# Patient Record
Sex: Female | Born: 1985 | Race: Black or African American | Hispanic: No | Marital: Single | Smoking: Former smoker
Health system: Southern US, Community
[De-identification: ages and names within clinical notes are randomized; demographics above are authoritative.]

## PROBLEM LIST (undated history)

## (undated) ENCOUNTER — Emergency Department (HOSPITAL_COMMUNITY): Admission: EM | Discharge status: Absent without leave | Payer: 59

## (undated) DIAGNOSIS — K439 Ventral hernia without obstruction or gangrene: Secondary | ICD-10-CM

## (undated) DIAGNOSIS — K219 Gastro-esophageal reflux disease without esophagitis: Secondary | ICD-10-CM

## (undated) DIAGNOSIS — L0502 Pilonidal sinus with abscess: Secondary | ICD-10-CM

## (undated) HISTORY — DX: Ventral hernia without obstruction or gangrene: K43.9

## (undated) HISTORY — DX: Pilonidal sinus with abscess: L05.02

---
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
## 2004-10-27 ENCOUNTER — Emergency Department (HOSPITAL_COMMUNITY): Admission: EM | Admit: 2004-10-27 | Discharge: 2004-10-27 | Payer: Self-pay | Admitting: Emergency Medicine

## 2004-12-20 ENCOUNTER — Emergency Department (HOSPITAL_COMMUNITY): Admission: EM | Admit: 2004-12-20 | Discharge: 2004-12-20 | Payer: Self-pay | Admitting: Emergency Medicine

## 2005-05-27 ENCOUNTER — Other Ambulatory Visit: Admission: RE | Admit: 2005-05-27 | Discharge: 2005-05-27 | Payer: Self-pay | Admitting: Obstetrics and Gynecology

## 2005-06-14 ENCOUNTER — Inpatient Hospital Stay (HOSPITAL_COMMUNITY): Admission: AD | Admit: 2005-06-14 | Discharge: 2005-06-14 | Payer: Self-pay | Admitting: Obstetrics and Gynecology

## 2005-07-13 ENCOUNTER — Ambulatory Visit (HOSPITAL_COMMUNITY): Admission: RE | Admit: 2005-07-13 | Discharge: 2005-07-13 | Payer: Self-pay | Admitting: Obstetrics and Gynecology

## 2005-11-17 ENCOUNTER — Inpatient Hospital Stay (HOSPITAL_COMMUNITY): Admission: AD | Admit: 2005-11-17 | Discharge: 2005-11-20 | Payer: Self-pay | Admitting: Obstetrics and Gynecology

## 2005-12-29 ENCOUNTER — Other Ambulatory Visit: Admission: RE | Admit: 2005-12-29 | Discharge: 2005-12-29 | Payer: Self-pay | Admitting: Obstetrics and Gynecology

## 2006-05-21 ENCOUNTER — Emergency Department (HOSPITAL_COMMUNITY): Admission: EM | Admit: 2006-05-21 | Discharge: 2006-05-21 | Payer: Self-pay | Admitting: Emergency Medicine

## 2008-10-12 ENCOUNTER — Emergency Department (HOSPITAL_COMMUNITY): Admission: EM | Admit: 2008-10-12 | Discharge: 2008-10-12 | Payer: Self-pay | Admitting: Emergency Medicine

## 2009-10-09 ENCOUNTER — Emergency Department (HOSPITAL_BASED_OUTPATIENT_CLINIC_OR_DEPARTMENT_OTHER): Admission: EM | Admit: 2009-10-09 | Discharge: 2009-10-09 | Payer: Self-pay | Admitting: Emergency Medicine

## 2010-12-23 ENCOUNTER — Emergency Department (HOSPITAL_COMMUNITY)
Admission: EM | Admit: 2010-12-23 | Discharge: 2010-12-23 | Payer: Self-pay | Source: Home / Self Care | Admitting: Family Medicine

## 2010-12-28 LAB — POCT URINALYSIS DIPSTICK: Hgb urine dipstick: NEGATIVE

## 2011-03-10 ENCOUNTER — Emergency Department (HOSPITAL_BASED_OUTPATIENT_CLINIC_OR_DEPARTMENT_OTHER)
Admission: EM | Admit: 2011-03-10 | Discharge: 2011-03-10 | Disposition: A | Discharge status: Home / Self Care | Payer: Managed Care, Other (non HMO) | Attending: Emergency Medicine | Admitting: Emergency Medicine

## 2011-03-10 DIAGNOSIS — R002 Palpitations: Secondary | ICD-10-CM | POA: Insufficient documentation

## 2011-03-10 LAB — URINALYSIS, ROUTINE W REFLEX MICROSCOPIC
Bilirubin Urine: NEGATIVE
Glucose, UA: NEGATIVE mg/dL
Hgb urine dipstick: NEGATIVE
Ketones, ur: 15 mg/dL — AB
Nitrite: NEGATIVE
Protein, ur: NEGATIVE mg/dL
Specific Gravity, Urine: 1.024 (ref 1.005–1.030)
Urobilinogen, UA: 1 mg/dL (ref 0.0–1.0)
pH: 7 (ref 5.0–8.0)

## 2011-03-10 LAB — CBC
HCT: 35.5 % — ABNORMAL LOW (ref 36.0–46.0)
Hemoglobin: 12.2 g/dL (ref 12.0–15.0)
MCH: 29.4 pg (ref 26.0–34.0)
MCHC: 34.4 g/dL (ref 30.0–36.0)
MCV: 85.5 fL (ref 78.0–100.0)
Platelets: 234 10*3/uL (ref 150–400)
RBC: 4.15 MIL/uL (ref 3.87–5.11)
RDW: 11.7 % (ref 11.5–15.5)
WBC: 6.6 10*3/uL (ref 4.0–10.5)

## 2011-03-10 LAB — BASIC METABOLIC PANEL
BUN: 11 mg/dL (ref 6–23)
CO2: 25 mEq/L (ref 19–32)
Calcium: 9.1 mg/dL (ref 8.4–10.5)
Chloride: 104 mEq/L (ref 96–112)
Creatinine, Ser: 0.8 mg/dL (ref 0.4–1.2)
GFR calc Af Amer: >60 mL/min (ref >60)
GFR calc non Af Amer: >60 mL/min (ref >60)
Glucose, Bld: 100 mg/dL — ABNORMAL HIGH (ref 70–99)
Potassium: 4.4 mEq/L (ref 3.5–5.1)
Sodium: 141 mEq/L (ref 135–145)

## 2011-03-10 LAB — DIFFERENTIAL
Basophils Absolute: 0 10*3/uL (ref 0.0–0.1)
Eosinophils Absolute: 0 10*3/uL (ref 0.0–0.7)
Lymphocytes Relative: 28 % (ref 12–46)
Monocytes Absolute: 0.6 10*3/uL (ref 0.1–1.0)
Monocytes Relative: 9 % (ref 3–12)
Neutro Abs: 4.2 10*3/uL (ref 1.7–7.7)

## 2011-03-10 LAB — POCT CARDIAC MARKERS: Myoglobin, poc: 35.7 ng/mL (ref 12–200)

## 2011-03-10 LAB — D-DIMER, QUANTITATIVE: D-Dimer, Quant: <0.22 ug/mL-FEU (ref 0.00–0.48)

## 2011-03-10 LAB — PREGNANCY, URINE: Preg Test, Ur: NEGATIVE

## 2011-09-07 LAB — COMPREHENSIVE METABOLIC PANEL
CO2: 29
Calcium: 9.1
GFR calc Af Amer: >60
GFR calc non Af Amer: >60
Potassium: 3.5

## 2011-09-07 LAB — CBC
HCT: 35.9 — ABNORMAL LOW
MCHC: 33.4
MCV: 88.5
RBC: 4.06
WBC: 6.6

## 2011-09-07 LAB — URINALYSIS, ROUTINE W REFLEX MICROSCOPIC
Hgb urine dipstick: NEGATIVE
Nitrite: NEGATIVE
Urobilinogen, UA: 1
pH: 6.5

## 2011-09-07 LAB — WET PREP, GENITAL

## 2011-09-07 LAB — DIFFERENTIAL
Basophils Relative: 0
Eosinophils Absolute: 0
Lymphocytes Relative: 19
Lymphs Abs: 1.2
Monocytes Relative: 12
Neutro Abs: 4.6

## 2011-09-07 LAB — GC/CHLAMYDIA PROBE AMP, GENITAL: Chlamydia, DNA Probe: POSITIVE — AB

## 2011-09-07 LAB — URINE MICROSCOPIC-ADD ON

## 2011-09-24 ENCOUNTER — Other Ambulatory Visit: Payer: Self-pay | Admitting: Obstetrics and Gynecology

## 2011-09-24 ENCOUNTER — Other Ambulatory Visit (HOSPITAL_COMMUNITY)
Admission: RE | Admit: 2011-09-24 | Discharge: 2011-09-24 | Disposition: A | Discharge status: Home / Self Care | Payer: Managed Care, Other (non HMO) | Source: Ambulatory Visit | Attending: Obstetrics and Gynecology | Admitting: Obstetrics and Gynecology

## 2011-09-24 DIAGNOSIS — Z01419 Encounter for gynecological examination (general) (routine) without abnormal findings: Secondary | ICD-10-CM | POA: Insufficient documentation

## 2012-03-13 ENCOUNTER — Other Ambulatory Visit (INDEPENDENT_AMBULATORY_CARE_PROVIDER_SITE_OTHER): Payer: Self-pay | Admitting: Surgery

## 2012-03-13 ENCOUNTER — Ambulatory Visit (INDEPENDENT_AMBULATORY_CARE_PROVIDER_SITE_OTHER): Payer: Managed Care, Other (non HMO) | Admitting: Surgery

## 2012-03-13 ENCOUNTER — Encounter (INDEPENDENT_AMBULATORY_CARE_PROVIDER_SITE_OTHER): Payer: Self-pay | Admitting: Surgery

## 2012-03-13 VITALS — BP 104/70 | HR 72 | Temp 97.6°F | Resp 12 | Ht 66.0 in | Wt 149.8 lb

## 2012-03-13 DIAGNOSIS — K439 Ventral hernia without obstruction or gangrene: Secondary | ICD-10-CM

## 2012-03-13 DIAGNOSIS — F17200 Nicotine dependence, unspecified, uncomplicated: Secondary | ICD-10-CM | POA: Insufficient documentation

## 2012-03-13 NOTE — Progress Notes (Signed)
Subjective:     Patient ID: Crystal Wolfe, female   DOB: 1985/12/11, 26 y.o.   MRN: [id number]  HPI  Crystal Wolfe  Sep 16, 1986 [id number]  Patient Care Team: Bebe Liter as PCP - General (Nurse Practitioner)  This patient is a 26 y.o.female who presents today for surgical evaluation.   Reason for visit: Consideration of surgery for supraumbilical ventral wall hernia  The patient returns. I had seen her in 2008. I saw her in November 2011. She's had supraumbilical swelling and pain. I diagnosed a supraumbilical ventral hernia. It has gotten larger. I have recommended surgery. She was getting pain and discomfort especially after eating or when she was straining. When she feels bloating or stress she notes it gets more swollen uncomfortable as well. It is now out all the time. She had some financial issues in the past. She notes because discussed larger and more uncomfortable she wished to reconsider surgery.  No fevers or chills. Weight has been stable. She smokes a few cigarettes a day. Had one episode of chest pain but workup was consistent with anxiety and no issues. His relatively active and has good exercise tolerance. 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  Patient Active Problem List  Diagnoses  . Ventral hernia - supraumbilical incarcerated  . Tobacco dependence    Past Medical History  Diagnosis Date  . Ventral hernia     History reviewed. No pertinent past surgical history.  History   Social History  . Marital Status: Single    Spouse Name: N/A    Number of Children: N/A  . Years of Education: N/A   Occupational History  . Not on file.   Social History Main Topics  . Smoking status: Current Everyday Smoker -- 0.2 packs/day  . Smokeless tobacco: Not on file  . Alcohol Use: Yes  . Drug Use: No  . Sexually Active:    Other Topics Concern  . Not on file   Social History Narrative  . No narrative on file     Family History  Problem Relation Age of Onset  . Hyperlipidemia Father   . Cancer Maternal Aunt     breast  . Hypertension Maternal Grandmother   . Hypertension Paternal Grandmother     No current outpatient prescriptions on file.     Allergies  Allergen Reactions  . Penicillins     ALL CILLIN drugs!!    BP 104/70  Pulse 72  Temp(Src) 97.6 F (36.4 C) (Temporal)  Resp 12  Ht 5\' 6"  (1.676 m)  Wt 149 lb 12.8 oz (67.949 kg)  BMI 24.18 kg/m2     Review of Systems  Constitutional: Negative for fever, chills, diaphoresis, appetite change and fatigue.  HENT: Negative for ear pain, sore throat, trouble swallowing, neck pain and ear discharge.   Eyes: Negative for photophobia, discharge and visual disturbance.  Respiratory: Negative for cough, choking, chest tightness and shortness of breath.   Cardiovascular: Negative for chest pain and palpitations.  Gastrointestinal: Positive for abdominal pain. Negative for nausea, vomiting, diarrhea, constipation, blood in stool, abdominal distention, anal bleeding and rectal pain.  Genitourinary: Negative for dysuria, frequency and difficulty urinating.  Musculoskeletal: Negative for myalgias and gait problem.  Skin: Negative for color change, pallor and rash.  Neurological: Negative for dizziness, speech difficulty, weakness and numbness.  Hematological: Negative for adenopathy.  Psychiatric/Behavioral: Negative for confusion and agitation. The patient is not nervous/anxious.  Objective:   Physical Exam  Constitutional: She is oriented to person, place, and time. She appears well-developed and well-nourished. No distress.  HENT:  Head: Normocephalic.  Mouth/Throat: Oropharynx is clear and moist. No oropharyngeal exudate.  Eyes: Conjunctivae and EOM are normal. Pupils are equal, round, and reactive to light. No scleral icterus.  Neck: Normal range of motion. Neck supple. No tracheal deviation present.  Cardiovascular:  Normal rate, regular rhythm and intact distal pulses.   Pulmonary/Chest: Effort normal and breath sounds normal. No respiratory distress. She exhibits no tenderness.  Abdominal: Soft. She exhibits mass. She exhibits no distension. There is no tenderness. Hernia confirmed negative in the right inguinal area and confirmed negative in the left inguinal area.    Genitourinary: No vaginal discharge found.  Musculoskeletal: Normal range of motion. She exhibits no tenderness.  Lymphadenopathy:    She has no cervical adenopathy.       Right: No inguinal adenopathy present.       Left: No inguinal adenopathy present.  Neurological: She is alert and oriented to person, place, and time. No cranial nerve deficit. She exhibits normal muscle tone. Coordination normal.  Skin: Skin is warm and dry. No rash noted. She is not diaphoretic. No erythema.  Psychiatric: She has a normal mood and affect. Her behavior is normal. Judgment and thought content normal.       Assessment:     Supraumbilical VWH, now incarcerated with pain    Plan:     I again recommended surgery. Is not incarcerated. I think eventually doing it laparoscopically is to make sure she does not have a umbilical hernia as well. Another option is to primary repair. She is thin. However she is a smoker. She wishes to have mesh for long-term repair.  The anatomy & physiology of the abdominal wall was discussed.  The pathophysiology of hernias was discussed.  Natural history risks without surgery including progeressive enlargement, pain, incarceration & strangulation was discussed.   Contributors to complications such as smoking, obesity, diabetes, prior surgery, etc were discussed.   I feel the risks of no intervention will lead to serious problems that outweigh the operative risks; therefore, I recommended surgery to reduce and repair the hernia.  I explained laparoscopic techniques with possible need for an open approach.  I noted the probable  use of mesh to patch and/or buttress the hernia repair  Risks such as bleeding, infection, abscess, need for further treatment, heart attack, death, and other risks were discussed.  I noted a good likelihood this will help address the problem.   Goals of post-operative recovery were discussed as well.  Possibility that this will not correct all symptoms was explained.  I stressed the importance of low-impact activity, aggressive pain control, avoiding constipation, & not pushing through pain to minimize risk of post-operative chronic pain or injury. Possibility of reherniation especially with smoking, obesity, diabetes, immunosuppression, and other health conditions was discussed.  We will work to minimize complications.     An educational handout further explaining the pathology & treatment options was given as well.  Questions were answered.  The patient expresses understanding & wishes to proceed with surgery.  We talked to the patient about the dangers of smoking.  We stressed that tobacco use dramatically increases the risk of peri-operative complications such as infection, tissue necrosis leaving to problems with incision/wound and organ healing, heart attack, stroke, DVT, pulmonary embolism, and death.  We noted there are programs in our community to help stop  smoking.

## 2012-03-13 NOTE — Patient Instructions (Signed)
Hernia, Surgical Repair A hernia occurs when an internal organ pushes out through a weak spot in the belly (abdominal) wall muscles. Hernias commonly occur in the groin and around the navel. Hernias often can be pushed back into place (reduced). Most hernias tend to get worse over time. Problems occur when abdominal contents get stuck in the opening (incarcerated hernia). The blood supply gets cut off (strangulated hernia). This is an emergency and needs surgery. Otherwise, hernia repair can be an elective procedure. This means you can schedule this at your convenience when an emergency is not present. Because complications can occur, if you decide to repair the hernia, it is best to do it soon. When it becomes an emergency procedure, there is increased risk of complications after surgery. CAUSES   Heavy lifting.   Obesity.   Prolonged coughing.   Straining to move your bowels.   Hernias can also occur through a cut (incision) by a surgeonafter an abdominal operation.  HOME CARE INSTRUCTIONS Before the repair:  Bed rest is not required. You may continue your normal activities, but avoid heavy lifting (more than 10 pounds) or straining. Cough gently. If you are a smoker, it is best to stop. Even the best hernia repair can break down with the continual strain of coughing.   Do not wear anything tight over your hernia. Do not try to keep it in with an outside bandage or truss. These can damage abdominal contents if they are trapped in the hernia sac.   Eat a normal diet. Avoid constipation. Straining over long periods of time to have a bowel movement will increase hernia size. It also can breakdown repairs. If you cannot do this with diet alone, laxatives or stool softeners may be used.  PRIOR TO SURGERY, SEEK IMMEDIATE MEDICAL CARE IF: You have problems (symptoms) of a trapped (incarcerated) hernia. Symptoms include:  An oral temperature above 102 F (38.9 C) develops, or as your caregiver  suggests.   Increasing abdominal pain.   Feeling sick to your stomach(nausea) and vomiting.   You stop passing gas or stool.   The hernia is stuck outside the abdomen, looks discolored, feels hard, or is tender.   You have any changes in your bowel habits or in the hernia that is unusual for you.  LET YOUR CAREGIVERS KNOW ABOUT THE FOLLOWING:  Allergies.   Medications taken including herbs, eye drops, over the counter medications, and creams.   Use of steroids (by mouth or creams).   Family or personal history of problems with anesthetics or Novocaine.   Possibility of pregnancy, if this applies.   Personal history of blood clots (thrombophlebitis).   Family or personal history of bleeding or blood problems.   Previous surgery.   Other health problems.  BEFORE THE PROCEDURE You should be present 1 hour prior to your procedure, or as directed by your caregiver.  AFTER THE PROCEDURE After surgery, you will be taken to the recovery area. A nurse will watch and check your progress there. Once you are awake, stable, and taking fluids well, you will be allowed to go home as long as there are no problems. Once home, an ice pack (wrapped in a light towel) applied to your operative site may help with discomfort. It may also keep the swelling down. Do not lift anything heavier than 10 pounds (4.55 kilograms). Take showers not baths. Do not drive while taking narcotics. Follow instructions as suggested by your caregiver.  SEEK IMMEDIATE MEDICAL CARE IF: After   surgery:  There is redness, swelling, or increasing pain in the wound.   There is pus coming from the wound.   There is drainage from a wound lasting longer than 1 day.   An unexplained oral temperature above 102 F (38.9 C) develops.   You notice a foul smell coming from the wound or dressing.   There is a breaking open of a wound (edged not staying together) after the sutures have been removed.   You notice increasing  pain in the shoulders (shoulder strap areas).   You develop dizzy episodes or fainting while standing.   You develop persistent nausea or vomiting.   You develop a rash.   You have difficulty breathing.   You develop any reaction or side effects to medications given.  MAKE SURE YOU:   Understand these instructions.   Will watch your condition.   Will get help right away if you are not doing well or get worse.  Document Released: 05/18/2001 Document Revised: 11/11/2011 Document Reviewed: 04/09/2008 [facility] Patient Information 2012 [place], [place].  Smoking Cessation, Tips for Success YOU CAN QUIT SMOKING If you are ready to quit smoking, congratulations! You have chosen to help yourself be healthier. Cigarettes bring nicotine, tar, carbon monoxide, and other irritants into your body. Your lungs, heart, and blood vessels will be able to work better without these poisons. There are many different ways to quit smoking. Nicotine gum, nicotine patches, a nicotine inhaler, or nicotine nasal spray can help with physical craving. Hypnosis, support groups, and medicines help break the habit of smoking. Here are some tips to help you quit for good.  Throw away all cigarettes.   Clean and remove all ashtrays from your home, work, and car.   On a card, write down your reasons for quitting. Carry the card with you and read it when you get the urge to smoke.   Cleanse your body of nicotine. Drink enough water and fluids to keep your urine clear or pale yellow. Do this after quitting to flush the nicotine from your body.   Learn to predict your moods. Do not let a bad situation be your excuse to have a cigarette. Some situations in your life might tempt you into wanting a cigarette.   Never have "just one" cigarette. It leads to wanting another and another. Remind yourself of your decision to quit.   Change habits associated with smoking. If you smoked while driving or when feeling stressed,  try other activities to replace smoking. Stand up when drinking your coffee. Brush your teeth after eating. Sit in a different chair when you read the paper. Avoid alcohol while trying to quit, and try to drink fewer caffeinated beverages. Alcohol and caffeine may urge you to smoke.   Avoid foods and drinks that can trigger a desire to smoke, such as sugary or spicy foods and alcohol.   Ask people who smoke not to smoke around you.   Have something planned to do right after eating or having a cup of coffee. Take a walk or exercise to perk you up. This will help to keep you from overeating.   Try a relaxation exercise to calm you down and decrease your stress. Remember, you may be tense and nervous for the first 2 weeks after you quit, but this will pass.   Find new activities to keep your hands busy. Play with a pen, coin, or rubber band. Doodle or draw things on paper.   Brush your teeth right  after eating. This will help cut down on the craving for the taste of tobacco after meals. You can try mouthwash, too.   Use oral substitutes, such as lemon drops, carrots, a cinnamon stick, or chewing gum, in place of cigarettes. Keep them handy so they are available when you have the urge to smoke.   When you have the urge to smoke, try deep breathing.   Designate your home as a nonsmoking area.   If you are a heavy smoker, ask your caregiver about a prescription for nicotine chewing gum. It can ease your withdrawal from nicotine.   Reward yourself. Set aside the cigarette money you save and buy yourself something nice.   Look for support from others. Join a support group or smoking cessation program. Ask someone at home or at work to help you with your plan to quit smoking.   Always ask yourself, "Do I need this cigarette or is this just a reflex?" Tell yourself, "Today, I choose not to smoke," or "I do not want to smoke." You are reminding yourself of your decision to quit, even if you do smoke a  cigarette.  HOW WILL I FEEL WHEN I QUIT SMOKING?  The benefits of not smoking start within days of quitting.   You may have symptoms of withdrawal because your body is used to nicotine (the addictive substance in cigarettes). You may crave cigarettes, be irritable, feel very hungry, cough often, get headaches, or have difficulty concentrating.   The withdrawal symptoms are only temporary. They are strongest when you first quit but will go away within 10 to 14 days.   When withdrawal symptoms occur, stay in control. Think about your reasons for quitting. Remind yourself that these are signs that your body is healing and getting used to being without cigarettes.   Remember that withdrawal symptoms are easier to treat than the major diseases that smoking can cause.   Even after the withdrawal is over, expect periodic urges to smoke. However, these cravings are generally short-lived and will go away whether you smoke or not. Do not smoke!   If you relapse and smoke again, do not lose hope. Most smokers quit 3 times before they are successful.   If you relapse, do not give up! Plan ahead and think about what you will do the next time you get the urge to smoke.  LIFE AS A NONSMOKER: MAKE IT FOR A MONTH, MAKE IT FOR LIFE Day 1: Hang this page where you will see it every day. Day 2: Get rid of all ashtrays, matches, and lighters. Day 3: Drink water. Breathe deeply between sips. Day 4: Avoid places with smoke-filled air, such as bars, clubs, or the smoking section of restaurants. Day 5: Keep track of how much money you save by not smoking. Day 6: Avoid boredom. Keep a good book with you or go to the movies. Day 7: Reward yourself! One week without smoking! Day 8: Make a dental appointment to get your teeth cleaned. Day 9: Decide how you will turn down a cigarette before it is offered to you. Day 10: Review your reasons for quitting. Day 11: Distract yourself. Stay active to keep your mind off  smoking and to relieve tension. Take a walk, exercise, read a book, do a crossword puzzle, or try a new hobby. Day 12: Exercise. Get off the bus before your stop or use stairs instead of escalators. Day 13: Call on friends for support and encouragement. Day 14: Reward  yourself! Two weeks without smoking! Day 15: Practice deep breathing exercises. Day 16: Bet a friend that you can stay a nonsmoker. Day 17: Ask to sit in nonsmoking sections of restaurants. Day 18: Hang up "No Smoking" signs. Day 19: Think of yourself as a nonsmoker. Day 20: Each morning, tell yourself you will not smoke. Day 21: Reward yourself! Three weeks without smoking! Day 22: Think of smoking in negative ways. Remember how it stains your teeth, gives you bad breath, and leaves you short of breath. Day 23: Eat a nutritious breakfast. Day 24:Do not relive your days as a smoker. Day 25: Hold a pencil in your hand when talking on the telephone. Day 26: Tell all your friends you do not smoke. Day 27: Think about how much better food tastes. Day 28: Remember, one cigarette is one too many. Day 29: Take up a hobby that will keep your hands busy. Day 30: Congratulations! One month without smoking! Give yourself a big reward. Your caregiver can direct you to community resources or hospitals for support, which may include:  Group support.   Education.   Hypnosis.   Subliminal therapy.  Document Released: 08/20/2004 Document Revised: 11/11/2011 Document Reviewed: 09/08/2009 [facility] Patient Information 2012 [place], [place].

## 2012-03-24 ENCOUNTER — Encounter (INDEPENDENT_AMBULATORY_CARE_PROVIDER_SITE_OTHER): Payer: Self-pay | Admitting: Surgery

## 2012-03-24 ENCOUNTER — Ambulatory Visit (INDEPENDENT_AMBULATORY_CARE_PROVIDER_SITE_OTHER): Payer: Managed Care, Other (non HMO) | Admitting: Surgery

## 2012-03-24 VITALS — BP 126/72 | HR 68 | Temp 97.8°F | Resp 18 | Ht 66.0 in | Wt 148.2 lb

## 2012-03-24 DIAGNOSIS — L0501 Pilonidal cyst with abscess: Secondary | ICD-10-CM

## 2012-03-24 HISTORY — PX: INCISE AND DRAIN ABCESS: PRO64

## 2012-03-24 MED ORDER — HYDROCODONE-ACETAMINOPHEN 5-325 MG PO TABS: 1.0000 | ORAL_TABLET | Freq: Four times a day (QID) | ORAL | Status: AC | PRN

## 2012-03-24 MED ORDER — SULFAMETHOXAZOLE-TRIMETHOPRIM 800-160 MG PO TABS: 2.0000 | ORAL_TABLET | Freq: Two times a day (BID) | ORAL | Status: DC

## 2012-03-24 NOTE — Progress Notes (Signed)
Subjective:     Patient ID: Crystal Wolfe, female   DOB: Sep 28, 1986, 26 y.o.   MRN: [id number]  HPI  Crystal Wolfe  [birth year] [id number]  Patient Care Team: Bebe Liter as PCP - General (Nurse Practitioner)  This patient is a 26 y.o.female who presents today for surgical evaluation.   Reason for visit: Recurrent pilonidal abscess.  Patient is a healthy female I've seen her for an umbilical hernia. I was planning to do umbilical hernia repair on her since it's gotten larger.   She noted 4 days ago she began to get pain on her tailbone. She has a history of a pilonidal abscesses that have been drained in the past. At least 2 times it has been drained. Many times just with antibiotics. Some spontaneously drained. She's not had the problem for 3 years. Because it worsened, she came in for evaluation.  Patient Active Problem List  Diagnoses  . Ventral hernia - supraumbilical incarcerated  . Tobacco dependence    Past Medical History  Diagnosis Date  . Ventral hernia   . Pilonidal cyst     History reviewed. No pertinent past surgical history.  History   Social History  . Marital Status: Single    Spouse Name: N/A    Number of Children: N/A  . Years of Education: N/A   Occupational History  . Not on file.   Social History Main Topics  . Smoking status: Current Everyday Smoker -- 0.2 packs/day    Types: Cigarettes  . Smokeless tobacco: Never Used  . Alcohol Use: Yes  . Drug Use: No  . Sexually Active: Not on file   Other Topics Concern  . Not on file   Social History Narrative  . No narrative on file    Family History  Problem Relation Age of Onset  . Hyperlipidemia Father   . Cancer Maternal Aunt     breast  . Hypertension Maternal Grandmother   . Hypertension Paternal Grandmother     No current outpatient prescriptions on file.     Allergies  Allergen Reactions  . Penicillins     ALL CILLIN drugs!!    BP 126/72  Pulse 68  Temp(Src)  97.8 F (36.6 C) (Temporal)  Resp 18  Ht 5\' 6"  (1.676 m)  Wt 148 lb 4 oz (67.246 kg)  BMI 23.93 kg/m2     Review of Systems  Constitutional: Negative for fever, chills and diaphoresis.  HENT: Negative for ear pain, sore throat and trouble swallowing.   Eyes: Negative for photophobia and visual disturbance.  Respiratory: Negative for cough and choking.   Cardiovascular: Negative for chest pain and palpitations.  Gastrointestinal: Negative for nausea, vomiting, abdominal pain, diarrhea, constipation and anal bleeding.  Genitourinary: Negative for dysuria, frequency and difficulty urinating.  Musculoskeletal: Negative for myalgias and gait problem.  Skin: Negative for color change, pallor and rash.  Neurological: Negative for dizziness, speech difficulty, weakness and numbness.  Hematological: Negative for adenopathy.  Psychiatric/Behavioral: Negative for confusion and agitation. The patient is not nervous/anxious.        Objective:   Physical Exam  Constitutional: She is oriented to person, place, and time. She appears well-developed and well-nourished. No distress.  HENT:  Head: Normocephalic.  Mouth/Throat: Oropharynx is clear and moist. No oropharyngeal exudate.  Eyes: Conjunctivae and EOM are normal. Pupils are equal, round, and reactive to light. No scleral icterus.  Neck: Normal range of motion. No tracheal deviation present.  Cardiovascular: Normal rate and intact  distal pulses.   Pulmonary/Chest: Effort normal. No respiratory distress. She exhibits no tenderness.  Abdominal: Soft. She exhibits no distension. There is no tenderness. Hernia confirmed negative in the right inguinal area and confirmed negative in the left inguinal area.       Incisions clean with normal healing ridges.  No hernias  Genitourinary: Vagina normal. No vaginal discharge found.       Perianal skin clean with good hygiene.  No pruritis.  No external skin tags / hemorrhoids of significance.   No  fissure.  No abscess/fistula.    Normal sphincter tone.    R medial gluteal 5x5cm induration w punctate intragluteal opening c/w pilonidal disease  Musculoskeletal: Normal range of motion. She exhibits no tenderness.  Lymphadenopathy:       Right: No inguinal adenopathy present.       Left: No inguinal adenopathy present.  Neurological: She is alert and oriented to person, place, and time. No cranial nerve deficit. She exhibits normal muscle tone. Coordination normal.  Skin: Skin is warm and dry. No rash noted. She is not diaphoretic.  Psychiatric: She has a normal mood and affect. Her behavior is normal.       Assessment:     Recurrent pilonidal abscess    Plan:     The anatomy & physiology of the lower back anorectal region was discussed.  The pathophysiology of pilonidal abscess and differential diagnosis was discussed.  Natural history progression was discussed.   I stressed the importance of a bowel regimen to have daily soft bowel movements to minimize progression of disease.     The patient's symptoms are not adequately controlled.  Non-operative treatment has not healed the abscess.  Therefore, I recommended incision & drainage of the abscess to allow the infection to resolve and heal.  Technique, risks, benefits, alternatives discussed.  The patient expressed understanding & wished to proceed.  The patient was positioned lateral decubitus.  I placed a field block with local anaesthetic.  I incised the skin over the abscess to release the infection.  I excised skin at the wound to have an adequate opening for drainage & prevent skin reclosure.  I packed the wound with ribbon NU-Gauze.  The patient was tearful and anxious at first but calmed down at the end after pain was under control and tolerated the procedure.   Educational handouts further explaining the pathology, treatment options, and bowel regimen were given.  I think she is going to require more definitive excision once  this current infection is resolved. I noted I think this needs to be resolved first before putting and plastic mesh for her umbilical hernia. She agrees. We will have the patient return to clinic for close follow up to make sure the infection heals.  She expressed understanding and appreciation.

## 2012-03-24 NOTE — Patient Instructions (Signed)
WOUND CARE  It is important that the wound be kept open.   -Keeping the skin edges apart will allow the wound to gradually heal from the base upwards.   - If the skin edges of the wound close too early, a new fluid pocket can form and infection can occur. -This is the reason to pack deeper wounds with gauze or ribbon -This is why drained wounds cannot be sewed closed right away  A healthy wound should form a lining of bright red "beefy" granulating tissue that will help shrink the wound and help the edges grow new skin into it.   -A little mucus / yellow discharge is normal (the body's natural way to try and form a scab) and should be gently washed off with soap and water with daily dressing changes.  -Green or foul smelling drainage implies bacterial colonization and can slow wound healing - a short course of antibiotic ointment (3-5 days) can help it clear up.  Call the doctor if it does not improve or worsens  -Avoid use of antibiotic ointments for more than a week as they can slow wound healing over time.    -Sometimes other wound care products will be used to reduce need for dressing changes and/or help clean up dirty wounds -Sometimes the surgeon needs to debride the wound in the office to remove dead or infected tissue out of the wound so it can heal more quickly and safely.    Change the dressing at least once a day -Wash the wound with mild soap and water gently every day.  It is good to shower or bathe the wound to help it clean out. -Use clean 4x4 gauze for medium/large wounds or ribbon plain NU-gauze for smaller wounds (it does not need to be sterile, just clean) -Keep the raw wound moist with a little saline or KY (saline) gel on the gauze.  -A dry wound will take longer to heal.  -Keep the skin dry around the wound to prevent breakdown and irritation. -Pack the wound down to the base -The goal is to keep the skin apart, not overpack the wound -Use a Q-tip or blunt-tipped kabob  stick toothpick to push the gauze down to the base in narrow or deep wounds   -Cover with a clean gauze and tape -paper or Medipore tape tend to be gentle on the skin -rotate the orientation of the tape to avoid repeated stress/trauma on the skin -using an ACE or Coban wrap on wounds on arms or legs can be used instead.  Complete all antibiotics through the entire prescription to help the infection heal and prevent new places of infection   Returning the see the surgeon is helpful to follow the healing process and help the wound close as fast as possible.  Pilonidal Cyst A pilonidal cyst occurs when hairs get trapped (ingrown) beneath the skin in the crease between the buttocks over your sacrum (the bone under that crease). Pilonidal cysts are most common in young men with a lot of body hair. When the cyst is ruptured (breaks) or leaking, fluid from the cyst may cause burning and itching. If the cyst becomes infected, it causes a painful swelling filled with pus (abscess). The pus and trapped hairs need to be removed (often by lancing) so that the infection can heal. However, recurrence is common and an operation may be needed to remove the cyst. HOME CARE INSTRUCTIONS   If the cyst was NOT INFECTED:   Keep the   area clean and dry. Bathe or shower daily. Wash the area well with a germ-killing soap. Warm tub baths may help prevent infection and help with drainage. Dry the area well with a towel.   Avoid tight clothing to keep area as moisture free as possible.   Keep area between buttocks as free of hair as possible. A depilatory may be used.   If the cyst WAS INFECTED and needed to be drained:   Your caregiver packed the wound with gauze to keep the wound open. This allows the wound to heal from the inside outwards and continue draining.   Return for a wound check in 1 day or as suggested.   If you take tub baths or showers, repack the wound with gauze following them. Sponge baths (at the  sink) are a good alternative.   If an antibiotic was ordered to fight the infection, take as directed.   Only take over-the-counter or prescription medicines for pain, discomfort, or fever as directed by your caregiver.   After the drain is removed, use sitz baths for 20 minutes 4 times per day. Clean the wound gently with mild unscented soap, pat dry, and then apply a dry dressing.  SEEK MEDICAL CARE IF:   You have increased pain, swelling, redness, drainage, or bleeding from the area.   You have a fever.   You have muscles aches, dizziness, or a general ill feeling.  Document Released: 11/19/2000 Document Revised: 11/11/2011 Document Reviewed: 01/17/2009 Exit[employer] Patient Information 2012 [employer]. 

## 2012-03-27 ENCOUNTER — Telehealth (INDEPENDENT_AMBULATORY_CARE_PROVIDER_SITE_OTHER): Payer: Self-pay | Admitting: General Surgery

## 2012-03-27 NOTE — Telephone Encounter (Signed)
Pt calling in follow-up to I&D pilonidal abscess.  The wound was packed by Dr. Michaell Cowing with NU-gauze and it has begun to trail out.  Pt asking if she should remove the packing or leave it alone.  Advised OK to remove it if it is already coming out.  She states "it is hard and I'm afraid to do it."  Instructed to use the saline to moisten it first, then it will come out easily.  She is still hesitant.  Pt also states she has not been taking her antibiotic as ordered because it makes her sick.  [She was ordered Septra 2 BID for 2 weeks.]  She has been taking 1 pill twice a day "mostly."  Recommended she take both pills just before finishing meals, letting the food buffer the meds through her stomach.  She states she will try.

## 2012-04-03 ENCOUNTER — Encounter (INDEPENDENT_AMBULATORY_CARE_PROVIDER_SITE_OTHER): Payer: Self-pay | Admitting: Surgery

## 2012-04-03 ENCOUNTER — Ambulatory Visit (INDEPENDENT_AMBULATORY_CARE_PROVIDER_SITE_OTHER): Payer: Managed Care, Other (non HMO) | Admitting: Surgery

## 2012-04-03 VITALS — BP 118/72 | HR 78 | Temp 99.6°F | Resp 16 | Ht 66.0 in | Wt 152.1 lb

## 2012-04-03 DIAGNOSIS — L0501 Pilonidal cyst with abscess: Secondary | ICD-10-CM

## 2012-04-03 MED ORDER — SULFAMETHOXAZOLE-TRIMETHOPRIM 800-160 MG PO TABS: 1.0000 | ORAL_TABLET | Freq: Two times a day (BID) | ORAL | Status: AC

## 2012-04-03 NOTE — Patient Instructions (Signed)
Pilonidal Cyst A pilonidal cyst occurs when hairs get trapped (ingrown) beneath the skin in the crease between the buttocks over your sacrum (the bone under that crease). Pilonidal cysts are most common in young men with a lot of body hair. When the cyst is ruptured (breaks) or leaking, fluid from the cyst may cause burning and itching. If the cyst becomes infected, it causes a painful swelling filled with pus (abscess). The pus and trapped hairs need to be removed (often by lancing) so that the infection can heal. However, recurrence is common and an operation may be needed to remove the cyst. HOME CARE INSTRUCTIONS   If the cyst was NOT INFECTED:   Keep the area clean and dry. Bathe or shower daily. Wash the area well with a germ-killing soap. Warm tub baths may help prevent infection and help with drainage. Dry the area well with a towel.   Avoid tight clothing to keep area as moisture free as possible.   Keep area between buttocks as free of hair as possible. A depilatory may be used.   If the cyst WAS INFECTED and needed to be drained:   Your caregiver packed the wound with gauze to keep the wound open. This allows the wound to heal from the inside outwards and continue draining.   Return for a wound check in 1 day or as suggested.   If you take tub baths or showers, repack the wound with gauze following them. Sponge baths (at the sink) are a good alternative.   If an antibiotic was ordered to fight the infection, take as directed.   Only take over-the-counter or prescription medicines for pain, discomfort, or fever as directed by your caregiver.   After the drain is removed, use sitz baths for 20 minutes 4 times per day. Clean the wound gently with mild unscented soap, pat dry, and then apply a dry dressing.  SEEK MEDICAL CARE IF:   You have increased pain, swelling, redness, drainage, or bleeding from the area.   You have a fever.   You have muscles aches, dizziness, or a  general ill feeling.  Document Released: 11/19/2000 Document Revised: 11/11/2011 Document Reviewed: 01/17/2009 [employer] Patient Information 2012 [employer]. 

## 2012-04-03 NOTE — Progress Notes (Signed)
Subjective:     Patient ID: Crystal Wolfe, female   DOB: 27-Apr-1986, 26 y.o.   MRN: [id number]  HPI   Crystal Wolfe  Mar 17, 1986 [id number]  Patient Care Team: Bebe Liter as PCP - General (Nurse Practitioner)  This patient is a 26 y.o.female who presents today for surgical evaluation.   Reason for visit: Recurrent pilonidal abscess.  Procedure: I&D 03/24/2012  Patient is a healthy female I've seen her for an umbilical hernia. I was planning to do umbilical hernia repair on her since it's gotten larger.   She noted 4 days ago she began to get pain on her tailbone. She has a history of a pilonidal abscesses that have been drained in the past. At least 2 times it has been drained. Many times just with antibiotics. Some spontaneously drained. She's not had the problem for 3 years. Because it worsened, she came in for evaluation.  I drained it 03/24/2012.   She eventually was able to remove the packing. She was very anxious about it. She was put in cotton swabs around the wound. It is closed down. She has not been able to tolerate 2 b.i.d. Bactrim due to nausea. She's been doing 2 QAM.  Patient Active Problem List  Diagnoses  . Ventral hernia - supraumbilical incarcerated  . Tobacco dependence  . Pilonidal abscess    Past Medical History  Diagnosis Date  . Ventral hernia   . Pilonidal cyst     Past Surgical History  Procedure Date  . Incise and drain abcess 03/24/12    pilonidal cyst    History   Social History  . Marital Status: Single    Spouse Name: N/A    Number of Children: N/A  . Years of Education: N/A   Occupational History  . Not on file.   Social History Main Topics  . Smoking status: Passive Smoker -- 0.1 packs/day    Types: Cigarettes  . Smokeless tobacco: Never Used  . Alcohol Use: Yes  . Drug Use: No  . Sexually Active: Not on file   Other Topics Concern  . Not on file   Social History Narrative  . No narrative on file    Family  History  Problem Relation Age of Onset  . Hyperlipidemia Father   . Cancer Maternal Aunt     breast  . Hypertension Maternal Grandmother   . Hypertension Paternal Grandmother     Current Outpatient Prescriptions  Medication Sig Dispense Refill  . HYDROcodone-acetaminophen (NORCO) 5-325 MG per tablet Take 1-2 tablets by mouth every 6 (six) hours as needed for pain.  30 tablet  1  . sulfamethoxazole-trimethoprim (BACTRIM DS,SEPTRA DS) 800-160 MG per tablet Take 2 tablets by mouth 2 (two) times daily.  28 tablet  2     Allergies  Allergen Reactions  . Penicillins     ALL CILLIN drugs!!    BP 118/72  Pulse 78  Temp(Src) 99.6 F (37.6 C) (Temporal)  Resp 16  Ht 5\' 6"  (1.676 m)  Wt 152 lb 2 oz (69.003 kg)  BMI 24.55 kg/m2     Review of Systems  Constitutional: Negative for fever, chills and diaphoresis.  HENT: Negative for ear pain, sore throat and trouble swallowing.   Eyes: Negative for photophobia and visual disturbance.  Respiratory: Negative for cough and choking.   Cardiovascular: Negative for chest pain and palpitations.  Gastrointestinal: Negative for nausea, vomiting, abdominal pain, diarrhea, constipation and anal bleeding.  Genitourinary: Negative for dysuria, frequency  and difficulty urinating.  Musculoskeletal: Negative for myalgias and gait problem.  Skin: Negative for color change, pallor and rash.  Neurological: Negative for dizziness, speech difficulty, weakness and numbness.  Hematological: Negative for adenopathy.  Psychiatric/Behavioral: Negative for confusion and agitation. The patient is not nervous/anxious.        Objective:   Physical Exam  Constitutional: She is oriented to person, place, and time. She appears well-developed and well-nourished. No distress.  HENT:  Head: Normocephalic.  Mouth/Throat: Oropharynx is clear and moist. No oropharyngeal exudate.  Eyes: Conjunctivae and EOM are normal. Pupils are equal, round, and reactive to light.  No scleral icterus.  Neck: Normal range of motion. No tracheal deviation present.  Cardiovascular: Normal rate and intact distal pulses.   Pulmonary/Chest: Effort normal. No respiratory distress. She exhibits no tenderness.  Abdominal: Soft. She exhibits no distension. There is no tenderness. Hernia confirmed negative in the right inguinal area and confirmed negative in the left inguinal area.       Incisions clean with normal healing ridges.  No hernias  Genitourinary: Vagina normal. No vaginal discharge found.       Perianal skin clean with good hygiene.  No pruritis.  No external skin tags / hemorrhoids of significance.   No fissure.  No abscess/fistula.    R medial gluteal wound closed with 1x2cm firmness - markedly improved.  Musculoskeletal: Normal range of motion. She exhibits no tenderness.  Lymphadenopathy:       Right: No inguinal adenopathy present.       Left: No inguinal adenopathy present.  Neurological: She is alert and oriented to person, place, and time. No cranial nerve deficit. She exhibits normal muscle tone. Coordination normal.  Skin: Skin is warm and dry. No rash noted. She is not diaphoretic.  Psychiatric: She has a normal mood and affect. Her behavior is normal.       Assessment:     Recurrent pilonidal abscess s/p I&D, healing well    Plan:     I think we need to delay the umbilical hernia repair until she is several weeks out from the pilonidal disease & antibiotics to ensure she will not have recurrent infections I would make mesh and hernia repair very complicated. I  She is at high risk of infection and complications until both issues are further apart from each other. Given the fact that she has had prior incision and drainages in the region, she is at high risk for recurrence. Maybe she benefit from a more formal excision and closure on the time of the hernia. Denies DVT separate, however, may need combined and. The key thing is to avoid any episodes of  infection. We'll delay the umbilical ventral hernia parents social and June  Bactrim one pill twice a day instead of doing only in the morning.  The anatomy of the intragluteal cleft was discussed. Pathophysiology of pilonidal disease was discussed. The importance of keeping hairs trimmed to avoid recurrence was discussed. Discussion of options such as curretage, excision with closure vs leaving open was discussed. Risks of infection and need for incision and drainage & antibiotics were discussed.  I noted a good likelihood this will help address the problem.    At this point, I think the patient would best served with considering surgery to excise the diseased tissue. I will make an attempt to close but it may need to be left open to allow it to heal with secondary intention and wound packing. Possible recurrences involve muscle  flaps or different techniques were discussed as well. I noted that recurrence is higher with poor compliance on care maintenance and hygiene. The patient's questions were answered. The patient agrees to proceed.

## 2012-05-06 HISTORY — PX: HERNIA REPAIR: SHX51

## 2012-05-06 HISTORY — PX: VENTRAL HERNIA REPAIR: SHX424

## 2012-05-25 DIAGNOSIS — K436 Other and unspecified ventral hernia with obstruction, without gangrene: Secondary | ICD-10-CM

## 2012-06-01 ENCOUNTER — Telehealth (INDEPENDENT_AMBULATORY_CARE_PROVIDER_SITE_OTHER): Payer: Self-pay | Admitting: General Surgery

## 2012-06-01 NOTE — Telephone Encounter (Signed)
Pt calling to report no BM since surgery on 05/25/12.  She is passing gas and has increased her fluid intake.  She took Miralax once yesterday; there has been not urge to go at all.  Reassured pt and suggested she continue to drink lots of fluids, begin to walk more, increase Miralax to 1 capful in 10 oz BID (okay to use 2 capfuls/ 10oz BID.)  She understands and will try this.

## 2012-06-27 ENCOUNTER — Encounter (INDEPENDENT_AMBULATORY_CARE_PROVIDER_SITE_OTHER): Payer: Self-pay | Admitting: Surgery

## 2012-06-27 ENCOUNTER — Ambulatory Visit (INDEPENDENT_AMBULATORY_CARE_PROVIDER_SITE_OTHER): Payer: Managed Care, Other (non HMO) | Admitting: Surgery

## 2012-06-27 VITALS — BP 122/68 | HR 76 | Temp 98.8°F | Resp 12 | Ht 66.0 in | Wt 150.4 lb

## 2012-06-27 DIAGNOSIS — K439 Ventral hernia without obstruction or gangrene: Secondary | ICD-10-CM

## 2012-06-27 NOTE — Progress Notes (Signed)
Subjective:     Patient ID: Crystal Wolfe, female   DOB: Jul 31, 1986, 26 y.o.   MRN: [id number]  HPI   Crystal Wolfe  02/26/1986 [id number]  Patient Care Team: Bebe Liter as PCP - General (Nurse Practitioner)  This patient is a 26 y.o.female who presents today for surgical evaluation.   Procedure: Laparoscopic repair of supra-and periumbilical ventral hernias 6/202013  The patient returns ater a laparoscopic repair of periumbilical ventral hernias.  I had seen her in 2008. I saw her in November 2011.  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  Soreness is down.  Urinating fine.  Moving her bowels well.  Back to work.  Tends to avoid heavy exercise/lifting.  Minimal cigarettes now  Patient Active Problem List  Diagnosis  . Ventral hernia - supraumbilical incarcerated  . Tobacco dependence  . Pilonidal abscess    Past Medical History  Diagnosis Date  . Ventral hernia   . Pilonidal cyst     Past Surgical History  Procedure Date  . Incise and drain abcess 03/24/12    pilonidal cyst  . Ventral hernia repair 05/2012    lap supraumb VWH repair    History   Social History  . Marital Status: Single    Spouse Name: N/A    Number of Children: N/A  . Years of Education: N/A   Occupational History  . Not on file.   Social History Main Topics  . Smoking status: Passive Smoker -- 0.1 packs/day    Types: Cigarettes  . Smokeless tobacco: Never Used  . Alcohol Use: Yes  . Drug Use: No  . Sexually Active: Not on file   Other Topics Concern  . Not on file   Social History Narrative  . No narrative on file    Family History  Problem Relation Age of Onset  . Hyperlipidemia Father   . Cancer Maternal Aunt     breast  . Hypertension Maternal Grandmother   . Hypertension Paternal Grandmother   . Diabetes Paternal Grandmother     No current outpatient prescriptions on file.     Allergies  Allergen Reactions  . Penicillins      ALL CILLIN drugs!!    BP 122/68  Pulse 76  Temp 98.8 F (37.1 C) (Temporal)  Resp 12  Ht 5\' 6"  (1.676 m)  Wt 150 lb 6.4 oz (68.221 kg)  BMI 24.28 kg/m2  LMP 06/07/2012     Review of Systems  Constitutional: Negative for fever, chills, diaphoresis, appetite change and fatigue.  HENT: Negative for ear pain, sore throat, trouble swallowing, neck pain and ear discharge.   Eyes: Negative for photophobia, discharge and visual disturbance.  Respiratory: Negative for cough, choking, chest tightness and shortness of breath.   Cardiovascular: Negative for chest pain and palpitations.  Gastrointestinal: Positive for abdominal pain. Negative for nausea, vomiting, diarrhea, constipation, blood in stool, abdominal distention, anal bleeding and rectal pain.  Genitourinary: Negative for dysuria, frequency and difficulty urinating.  Musculoskeletal: Negative for myalgias and gait problem.  Skin: Negative for color change, pallor and rash.  Neurological: Negative for dizziness, speech difficulty, weakness and numbness.  Hematological: Negative for adenopathy.  Psychiatric/Behavioral: Negative for confusion and agitation. The patient is not nervous/anxious.        Objective:   Physical Exam  Constitutional: She is oriented to person, place, and time. She appears well-developed and well-nourished. No distress.  HENT:  Head: Normocephalic.  Mouth/Throat: Oropharynx is clear and  moist. No oropharyngeal exudate.  Eyes: Conjunctivae and EOM are normal. Pupils are equal, round, and reactive to light. No scleral icterus.  Neck: Normal range of motion. Neck supple. No tracheal deviation present.  Cardiovascular: Normal rate, regular rhythm and intact distal pulses.   Pulmonary/Chest: Effort normal and breath sounds normal. No respiratory distress. She exhibits no tenderness.  Abdominal: Soft. She exhibits no distension and no mass. There is no tenderness. Hernia confirmed negative in the right  inguinal area and confirmed negative in the left inguinal area.       Incisions clean with normal healing ridges.  No hernias  Genitourinary: No vaginal discharge found.  Musculoskeletal: Normal range of motion. She exhibits no tenderness.  Lymphadenopathy:    She has no cervical adenopathy.       Right: No inguinal adenopathy present.       Left: No inguinal adenopathy present.  Neurological: She is alert and oriented to person, place, and time. No cranial nerve deficit. She exhibits normal muscle tone. Coordination normal.  Skin: Skin is warm and dry. No rash noted. She is not diaphoretic. No erythema.  Psychiatric: She has a normal mood and affect. Her behavior is normal. Judgment and thought content normal.       Assessment:     Supraumbilical VWH 1 month s/p repair, recovering well    Plan:     Increase activity as tolerated.  Do not push through pain.  Advanced on diet as tolerated. Bowel regimen to avoid problems.  Return to clinic p.r.n. The patient expressed understanding and appreciation

## 2012-06-27 NOTE — Patient Instructions (Signed)
Hernia, Surgical Repair Care After Refer to this sheet in the next few weeks. These discharge instructions provide you with general information on caring for yourself after you leave the hospital. Your caregiver may also give you specific instructions. Your treatment has been planned according to the most current medical practices available, but unavoidable complications sometimes occur. If you have any problems or questions after discharge, please call your caregiver. HOME CARE INSTRUCTIONS   It is normal to be sore for a couple weeks after surgery. See your caregiver if this seems to be getting worse rather than better.   Put ice on the operative site.   Put ice in a plastic bag.   Place a towel between your skin and the bag.   Leave the ice on for 15 to 20 minutes at a time, 3 to 4 times a day for the first 2 days.   Change bandages (dressings) as directed.   Keep the wound dry and clean. The wound may be washed gently with soap and water. Gently blot or dab the wound dry. Do not take baths, use swimming pools, or use hot tubs for 10 days, or as directed by your caregiver.   Only take over-the-counter or prescription medicines for pain, discomfort, or fever as directed by your caregiver.   Continue your normal diet as directed.   Do not drive until your caregiver says it is okay.   Do not lift anything more than 10 pounds or play contact sports for 3 weeks, or as directed.   Make an appointment to see your caregiver for stitches (sutures) or staple removal when instructed.  SEEK MEDICAL CARE IF:   You have increased bleeding coming from the wounds.   You have blood in your stool.   You see redness, swelling, or have increasing pain in the wounds.   You have fluid (pus) coming from the wound.   You have an oral temperature above 102 F (38.9 C).   You notice a bad smell coming from the wound or dressing.   You develop lightheadedness or feel faint.  SEEK IMMEDIATE  MEDICAL CARE IF:   You develop a rash.   You have difficulty breathing.   You develop any reaction or side effects to medicines given.  MAKE SURE YOU:   Understand these instructions.   Will watch your condition.   Will get help right away if you are not doing well or get worse.  Document Released: 06/11/2005 Document Revised: 11/11/2011 Document Reviewed: 10/22/2009 [facility] Patient Information 2012 [place], [place].

## 2012-12-15 ENCOUNTER — Other Ambulatory Visit (HOSPITAL_COMMUNITY)
Admission: RE | Admit: 2012-12-15 | Discharge: 2012-12-15 | Disposition: A | Discharge status: Home / Self Care | Payer: Managed Care, Other (non HMO) | Source: Ambulatory Visit | Attending: Obstetrics and Gynecology | Admitting: Obstetrics and Gynecology

## 2012-12-15 DIAGNOSIS — Z01419 Encounter for gynecological examination (general) (routine) without abnormal findings: Secondary | ICD-10-CM | POA: Insufficient documentation

## 2012-12-22 ENCOUNTER — Other Ambulatory Visit: Payer: Self-pay | Admitting: Obstetrics and Gynecology

## 2013-02-14 ENCOUNTER — Ambulatory Visit (INDEPENDENT_AMBULATORY_CARE_PROVIDER_SITE_OTHER): Payer: Managed Care, Other (non HMO) | Admitting: Surgery

## 2013-02-14 ENCOUNTER — Encounter (INDEPENDENT_AMBULATORY_CARE_PROVIDER_SITE_OTHER): Payer: Self-pay | Admitting: Surgery

## 2013-02-14 VITALS — BP 116/70 | HR 79 | Temp 97.9°F | Resp 16 | Ht 66.0 in | Wt 148.6 lb

## 2013-02-14 DIAGNOSIS — L0501 Pilonidal cyst with abscess: Secondary | ICD-10-CM

## 2013-02-14 MED ORDER — DOXYCYCLINE HYCLATE 100 MG PO TABS: 100.0000 mg | ORAL_TABLET | Freq: Two times a day (BID) | ORAL | Status: DC

## 2013-02-14 NOTE — Patient Instructions (Addendum)
See the Handout(s) we gave you.  Consider surgery to excise your pilonidal disease.  Please call our office at [phone] if you wish to schedule surgery or if you have further questions / concerns.   WOUND CARE  It is important that the wound be kept open.   -Keeping the skin edges apart will allow the wound to gradually heal from the base upwards.   - If the skin edges of the wound close too early, a new fluid pocket can form and infection can occur. -This is the reason to pack deeper wounds with gauze or ribbon -This is why drained wounds cannot be sewed closed right away  A healthy wound should form a lining of bright red "beefy" granulating tissue that will help shrink the wound and help the edges grow new skin into it.   -A little mucus / yellow discharge is normal (the body's natural way to try and form a scab) and should be gently washed off with soap and water with daily dressing changes.  -Green or foul smelling drainage implies bacterial colonization and can slow wound healing - a short course of antibiotic ointment (3-5 days) can help it clear up.  Call the doctor if it does not improve or worsens  -Avoid use of antibiotic ointments for more than a week as they can slow wound healing over time.    -Sometimes other wound care products will be used to reduce need for dressing changes and/or help clean up dirty wounds -Sometimes the surgeon needs to debride the wound in the office to remove dead or infected tissue out of the wound so it can heal more quickly and safely.    Change the dressing at least once a day -Wash the wound with mild soap and water gently every day.  It is good to shower or bathe the wound to help it clean out. -Use clean ribbon 1/4" plain NU-gauze for smaller wounds (it does not need to be sterile, just clean) -Keep the raw wound moist with a little saline or KY (saline) gel on the gauze.  -A dry wound will take longer to heal.  -Keep the skin dry around the  wound to prevent breakdown and irritation. -Pack the wound down to the base -The goal is to keep the skin apart, not overpack the wound -Use a Q-tip or blunt-tipped kabob stick toothpick to push the gauze down to the base in narrow or deep wounds   -Cover with a clean gauze and tape -paper or Medipore tape tend to be gentle on the skin -rotate the orientation of the tape to avoid repeated stress/trauma on the skin -using an ACE or Coban wrap on wounds on arms or legs can be used instead.  Complete all antibiotics through the entire prescription to help the infection heal and prevent new places of infection   Returning the see the surgeon is helpful to follow the healing process and help the wound close as fast as possible.    Pilonidal Cyst A pilonidal cyst occurs when hairs get trapped (ingrown) beneath the skin in the crease between the buttocks over your sacrum (the bone under that crease). Pilonidal cysts are most common in young men with a lot of body hair. When the cyst is ruptured (breaks) or leaking, fluid from the cyst may cause burning and itching. If the cyst becomes infected, it causes a painful swelling filled with pus (abscess). The pus and trapped hairs need to be removed (often by lancing)  so that the infection can heal. However, recurrence is common and an operation may be needed to remove the cyst. HOME CARE INSTRUCTIONS   If the cyst was NOT INFECTED:  Keep the area clean and dry. Bathe or shower daily. Wash the area well with a germ-killing soap. Warm tub baths may help prevent infection and help with drainage. Dry the area well with a towel.  Avoid tight clothing to keep area as moisture free as possible.  Keep area between buttocks as free of hair as possible. A depilatory may be used.  If the cyst WAS INFECTED and needed to be drained:  Your caregiver packed the wound with gauze to keep the wound open. This allows the wound to heal from the inside outwards and  continue draining.  Return for a wound check in 1 day or as suggested.  If you take tub baths or showers, repack the wound with gauze following them. Sponge baths (at the sink) are a good alternative.  If an antibiotic was ordered to fight the infection, take as directed.  Only take over-the-counter or prescription medicines for pain, discomfort, or fever as directed by your caregiver.  After the drain is removed, use sitz baths for 20 minutes 4 times per day. Clean the wound gently with mild unscented soap, pat dry, and then apply a dry dressing. SEEK MEDICAL CARE IF:   You have increased pain, swelling, redness, drainage, or bleeding from the area.  You have a fever.  You have muscles aches, dizziness, or a general ill feeling. Document Released: 11/19/2000 Document Revised: 02/14/2012 Document Reviewed: 01/17/2009 [facility] Patient Information 2013 [place], [place].

## 2013-02-14 NOTE — Progress Notes (Signed)
Subjective:     Patient ID: Crystal Wolfe, female   DOB: [birth year], 27 y.o.   MRN: [id number]  HPI  Crystal Wolfe  December 17, 1985 [id number]  Patient Care Team: Bebe Liter as PCP - General (Nurse Practitioner)  This patient is a 27 y.o.female who presents today for surgical evaluation at the request of self.   Reason for visit: Recurrent pilonidal abscess  Pleasant young female.  Had a periumbilical ventral hernias x2.  Laparoscopically repaired by me in 2013 after trying to get her to do it in 2008 & 2011.    Just before surgery, she developed pilonidal abscess.  I drained it.  She recovered relatively quickly from that & then had surgery for the hernias.  She has had history of pilonidal  pain and there but never true drainage before that.  She has not had any problems since drainage.  However a few days ago, she began to feel pain underneath her lump.  Has not tried Anaprox.  She really does not want a drain but is having a lot of pain and misery.  Her mother comes in with her today.  Her mother teases her that she is looking like an old woman  Patient Active Problem List  Diagnosis  . Ventral hernia - supraumbilical incarcerated  . Tobacco dependence  . Pilonidal abscess    Past Medical History  Diagnosis Date  . Ventral hernia   . Pilonidal cyst     Past Surgical History  Procedure Laterality Date  . Incise and drain abcess  03/24/12    pilonidal cyst  . Ventral hernia repair  05/2012    lap supraumb VWH repair  . Hernia repair      History   Social History  . Marital Status: Single    Spouse Name: N/A    Number of Children: N/A  . Years of Education: N/A   Occupational History  . Not on file.   Social History Main Topics  . Smoking status: Passive Smoke Exposure - Never Smoker -- 0.10 packs/day    Types: Cigarettes  . Smokeless tobacco: Never Used  . Alcohol Use: Yes  . Drug Use: No  . Sexually Active: Not on file   Other Topics Concern  . Not on  file   Social History Narrative  . No narrative on file    Family History  Problem Relation Age of Onset  . Hyperlipidemia Father   . Cancer Maternal Aunt     breast  . Hypertension Maternal Grandmother   . Hypertension Paternal Grandmother   . Diabetes Paternal Grandmother     No current outpatient prescriptions on file.   No current facility-administered medications for this visit.     Allergies  Allergen Reactions  . Penicillins Itching and Nausea And Vomiting    ALL CILLIN drugs!!    BP 116/70  Pulse 79  Temp(Src) 97.9 F (36.6 C) (Temporal)  Resp 16  Ht 5\' 6"  (1.676 m)  Wt 148 lb 9.6 oz (67.405 kg)  BMI 24 kg/m2  SpO2 98%  No results found.   Review of Systems  Constitutional: Negative for fever, chills, diaphoresis, appetite change and fatigue.  HENT: Negative for ear pain, sore throat, trouble swallowing, neck pain and ear discharge.   Eyes: Negative for photophobia, discharge and visual disturbance.  Respiratory: Negative for cough, choking, chest tightness and shortness of breath.   Cardiovascular: Negative for chest pain and palpitations.  Gastrointestinal: Negative for nausea, vomiting, abdominal pain, diarrhea, constipation,  anal bleeding and rectal pain.  Genitourinary: Negative for dysuria, frequency and difficulty urinating.  Musculoskeletal: Negative for myalgias and gait problem.  Skin: Positive for wound. Negative for color change, pallor and rash.  Neurological: Negative for dizziness, speech difficulty, weakness and numbness.  Hematological: Negative for adenopathy.  Psychiatric/Behavioral: Negative for confusion and agitation. The patient is not nervous/anxious.        Objective:   Physical Exam  Constitutional: She is oriented to person, place, and time. She appears well-developed and well-nourished. No distress.  HENT:  Head: Normocephalic.  Mouth/Throat: Oropharynx is clear and moist. No oropharyngeal exudate.  Eyes: Conjunctivae  and EOM are normal. Pupils are equal, round, and reactive to light. No scleral icterus.  Neck: Normal range of motion. Neck supple. No tracheal deviation present.  Cardiovascular: Normal rate, regular rhythm and intact distal pulses.   Pulmonary/Chest: Effort normal and breath sounds normal. No respiratory distress. She exhibits no tenderness.  Abdominal: Soft. She exhibits no distension and no mass. There is no tenderness. Hernia confirmed negative in the right inguinal area and confirmed negative in the left inguinal area.  Genitourinary: No vaginal discharge found.  Musculoskeletal: Normal range of motion. She exhibits no tenderness.       Back:  Lymphadenopathy:    She has no cervical adenopathy.       Right: No inguinal adenopathy present.       Left: No inguinal adenopathy present.  Neurological: She is alert and oriented to person, place, and time. No cranial nerve deficit. She exhibits normal muscle tone. Coordination normal.  Skin: Skin is warm and dry. No rash noted. She is not diaphoretic. No erythema.  Psychiatric: Her behavior is normal. Judgment and thought content normal. Her mood appears anxious.  Anxious but consolable.       Assessment:     Recurrent pilonidal abscess.     Plan:     Urgent incision and drainage.  She was hoping to avoid that..  T0o large to expect antibiotics alone to handle it.  She agreed.  The pathophysiology of subcutaneous abscess and differential diagnosis was discussed.  Natural history progression was discussed.  The patient's symptoms are not adequately controlled.  Non-operative treatment has not healed the abscess.  Therefore, I recommended incision & drainage of the abscess to allow the infection to resolve and heal.  Technique, risks, benefits, alternatives discussed.  The patient expressed understanding & wished to proceed.  I placed a field block with local anaesthetic.  Very sensitive at first but then better.  I used an 18-gauge needle  aspirate frank pus 1 cm deep to the old scar.  I used 40 mils of local eventually to block out the whole area going superficially and then deeply.  I incised the skin over the abscess to release the infection.  I was not able to encounter a deeper pocket despite probing down to the sacrum.  I excised skin at the wound to have an adequate opening for drainage & prevent skin reclosure.  I packed the wound with ribbon NU-Gauze.    The patient tolerated the procedure.  We will have the patient return to clinic for close follow up to make sure the infection heals.  This is her second documented attack by me.  I think she is going to require a more formal excision and closure to remove the chronic scar tissue.  I would like to wait for this infection to heal before attempting closure over drains.  The anatomy  of the intragluteal cleft was discussed. Pathophysiology of pilonidal disease was discussed. The importance of keeping hairs trimmed to avoid recurrence was discussed. Discussion of options such as curretage, excision with closure vs leaving open was discussed. Risks of infection and need for incision and drainage & antibiotics were discussed.  I noted a good likelihood this will help address the problem.    At this point, I think the patient would best served with considering surgery to excise the diseased tissue. I will make an attempt to close but it may need to be left open to allow it to heal with secondary intention and wound packing. Possible recurrences involve muscle flaps or different techniques were discussed as well. I noted that recurrence is higher with poor compliance on care maintenance and hygiene. The patient's questions were answered. The patient will think about it but is not ready to proceed.

## 2013-02-16 ENCOUNTER — Ambulatory Visit (INDEPENDENT_AMBULATORY_CARE_PROVIDER_SITE_OTHER): Payer: Managed Care, Other (non HMO)

## 2013-02-16 DIAGNOSIS — IMO0001 Reserved for inherently not codable concepts without codable children: Secondary | ICD-10-CM

## 2013-02-28 ENCOUNTER — Encounter (INDEPENDENT_AMBULATORY_CARE_PROVIDER_SITE_OTHER): Payer: Self-pay | Admitting: Surgery

## 2013-02-28 ENCOUNTER — Ambulatory Visit (INDEPENDENT_AMBULATORY_CARE_PROVIDER_SITE_OTHER): Payer: Managed Care, Other (non HMO) | Admitting: Surgery

## 2013-02-28 ENCOUNTER — Telehealth (INDEPENDENT_AMBULATORY_CARE_PROVIDER_SITE_OTHER): Payer: Self-pay

## 2013-02-28 VITALS — BP 112/64 | HR 89 | Temp 96.8°F | Resp 16 | Ht 66.0 in | Wt 148.4 lb

## 2013-02-28 DIAGNOSIS — L0501 Pilonidal cyst with abscess: Secondary | ICD-10-CM

## 2013-02-28 NOTE — Patient Instructions (Addendum)
WOUND CARE  It is important that the wound be kept open.   -Keeping the skin edges apart will allow the wound to gradually heal from the base upwards.   - If the skin edges of the wound close too early, a new fluid pocket can form and infection can occur. -This is the reason to pack deeper wounds with gauze or ribbon -This is why drained wounds cannot be sewed closed right away  A healthy wound should form a lining of bright red "beefy" granulating tissue that will help shrink the wound and help the edges grow new skin into it.   -A little mucus / yellow discharge is normal (the body's natural way to try and form a scab) and should be gently washed off with soap and water with daily dressing changes.  -Green or foul smelling drainage implies bacterial colonization and can slow wound healing - a short course of antibiotic ointment (3-5 days) can help it clear up.  Call the doctor if it does not improve or worsens  -Avoid use of antibiotic ointments for more than a week as they can slow wound healing over time.    -Sometimes other wound care products will be used to reduce need for dressing changes and/or help clean up dirty wounds -Sometimes the surgeon needs to debride the wound in the office to remove dead or infected tissue out of the wound so it can heal more quickly and safely.    Change the dressing at least once a day -Wash the wound with mild soap and water gently every day.  It is good to shower or bathe the wound to help it clean out. -Use clean 4x4 gauze for medium/large wounds or ribbon plain NU-gauze for smaller wounds (it does not need to be sterile, just clean) -Keep the raw wound moist with a little saline or KY (saline) gel on the gauze.  -A dry wound will take longer to heal.  -Keep the skin dry around the wound to prevent breakdown and irritation. -Pack the wound down to the base -The goal is to keep the skin apart, not overpack the wound -Use a Q-tip or blunt-tipped kabob  stick toothpick to push the gauze down to the base in narrow or deep wounds   -Cover with a clean gauze and tape -paper or Medipore tape tend to be gentle on the skin -rotate the orientation of the tape to avoid repeated stress/trauma on the skin -using an ACE or Coban wrap on wounds on arms or legs can be used instead.  Complete all antibiotics through the entire prescription to help the infection heal and prevent new places of infection   Returning the see the surgeon is helpful to follow the healing process and help the wound close as fast as possible.  Consider surgery to more formally excise the pilonidal disease to prevent further attacks  Pilonidal Cyst A pilonidal cyst occurs when hairs get trapped (ingrown) beneath the skin in the crease between the buttocks over your sacrum (the bone under that crease). Pilonidal cysts are most common in young men with a lot of body hair. When the cyst is ruptured (breaks) or leaking, fluid from the cyst may cause burning and itching. If the cyst becomes infected, it causes a painful swelling filled with pus (abscess). The pus and trapped hairs need to be removed (often by lancing) so that the infection can heal. However, recurrence is common and an operation may be needed to remove the cyst. HOME  CARE INSTRUCTIONS   If the cyst was NOT INFECTED:  Keep the area clean and dry. Bathe or shower daily. Wash the area well with a germ-killing soap. Warm tub baths may help prevent infection and help with drainage. Dry the area well with a towel.  Avoid tight clothing to keep area as moisture free as possible.  Keep area between buttocks as free of hair as possible. A depilatory may be used.  If the cyst WAS INFECTED and needed to be drained:  Your caregiver packed the wound with gauze to keep the wound open. This allows the wound to heal from the inside outwards and continue draining.  Return for a wound check in 1 day or as suggested.  If you take  tub baths or showers, repack the wound with gauze following them. Sponge baths (at the sink) are a good alternative.  If an antibiotic was ordered to fight the infection, take as directed.  Only take over-the-counter or prescription medicines for pain, discomfort, or fever as directed by your caregiver.  After the drain is removed, use sitz baths for 20 minutes 4 times per day. Clean the wound gently with mild unscented soap, pat dry, and then apply a dry dressing. SEEK MEDICAL CARE IF:   You have increased pain, swelling, redness, drainage, or bleeding from the area.  You have a fever.  You have muscles aches, dizziness, or a general ill feeling. Document Released: 11/19/2000 Document Revised: 02/14/2012 Document Reviewed: 01/17/2009 [facility] Patient Information 2013 [place], [place].

## 2013-02-28 NOTE — Progress Notes (Signed)
Subjective:     Patient ID: Crystal Wolfe, female   DOB: Apr 10, 1986, 27 y.o.   MRN: [id number]  HPI   Crystal Wolfe  02-21-1986 [id number]  Patient Care Team: Merlene Laughter. Renae Gloss, MD as PCP - General (Internal Medicine)  This patient is a 27 y.o.female who presents today for surgical evaluation at the request of self.   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  Had a periumbilical ventral hernias x2.  Laparoscopically repaired by me in 29 after trying to get her to do it in 2008 & 2011.    Just before surgery, she developed pilonidal abscess.  I drained it.  She recovered relatively quickly from that & then had surgery for the hernias.  She has had history of pilonidal  pain and there but never true drainage before that.  She began to feel pain underneath her lump.  Has not tried antibiotics.   I drained an abscess last visit .  Since then, she is feeling better packing fell out.  Minimal drainage.  She completed Abx.  We are trying to get results of the culture.  Patient Active Problem List  Diagnosis  . Tobacco dependence  . Pilonidal abscess    Past Medical History  Diagnosis Date  . Ventral hernia   . Pilonidal sinus with abscess April2013    drained [id number]    Past Surgical History  Procedure Laterality Date  . Incise and drain abcess  03/24/12    pilonidal abscess  . Ventral hernia repair  05/2012    lap supraumb VWH repair    History   Social History  . Marital Status: Single    Spouse Name: N/A    Number of Children: N/A  . Years of Education: N/A   Occupational History  . Not on file.   Social History Main Topics  . Smoking status: Passive Smoke Exposure - Never Smoker -- 0.10 packs/day    Types: Cigarettes  . Smokeless tobacco: Never Used  . Alcohol Use: Yes  . Drug Use: No  . Sexually Active: Not on file   Other Topics Concern  . Not on file   Social History Narrative  . No narrative on file     Family History  Problem Relation Age of Onset  . Hyperlipidemia Father   . Cancer Maternal Aunt     breast  . Hypertension Maternal Grandmother   . Hypertension Paternal Grandmother   . Diabetes Paternal Grandmother     No current outpatient prescriptions on file.   No current facility-administered medications for this visit.     Allergies  Allergen Reactions  . Penicillins Itching and Nausea And Vomiting    ALL CILLIN drugs!!    BP 112/64  Pulse 89  Temp(Src) 96.8 F (36 C) (Temporal)  Resp 16  Ht 5\' 6"  (1.676 m)  Wt 148 lb 6.4 oz (67.314 kg)  BMI 23.96 kg/m2  SpO2 98%  No results found.   Review of Systems  Constitutional: Negative for fever, chills, diaphoresis, appetite change and fatigue.  HENT: Negative for ear pain, sore throat, trouble swallowing, neck pain and ear discharge.   Eyes: Negative for photophobia, discharge and visual disturbance.  Respiratory: Negative for cough, choking, chest tightness and shortness of breath.   Cardiovascular: Negative for chest pain and palpitations.  Gastrointestinal: Negative for nausea, vomiting, abdominal pain, diarrhea, constipation, anal bleeding and rectal pain.  Genitourinary: Negative for dysuria, frequency and difficulty urinating.  Musculoskeletal:  Negative for myalgias and gait problem.  Skin: Positive for wound. Negative for color change, pallor and rash.  Neurological: Negative for dizziness, speech difficulty, weakness and numbness.  Hematological: Negative for adenopathy.  Psychiatric/Behavioral: Negative for confusion and agitation. The patient is not nervous/anxious.        Objective:   Physical Exam  Constitutional: She is oriented to person, place, and time. She appears well-developed and well-nourished. No distress.  HENT:  Head: Normocephalic.  Mouth/Throat: Oropharynx is clear and moist. No oropharyngeal exudate.  Eyes: Conjunctivae and EOM are normal. Pupils are equal, round, and reactive  to light. No scleral icterus.  Neck: Normal range of motion. Neck supple. No tracheal deviation present.  Cardiovascular: Normal rate, regular rhythm and intact distal pulses.   Pulmonary/Chest: Effort normal and breath sounds normal. No respiratory distress. She exhibits no tenderness.  Abdominal: Soft. She exhibits no distension and no mass. There is no tenderness. Hernia confirmed negative in the right inguinal area and confirmed negative in the left inguinal area.  Genitourinary: No vaginal discharge found.  Musculoskeletal: Normal range of motion. She exhibits no tenderness.       Back:  Lymphadenopathy:    She has no cervical adenopathy.       Right: No inguinal adenopathy present.       Left: No inguinal adenopathy present.  Neurological: She is alert and oriented to person, place, and time. No cranial nerve deficit. She exhibits normal muscle tone. Coordination normal.  Skin: Skin is warm and dry. No rash noted. She is not diaphoretic. No erythema.  Psychiatric: Her behavior is normal. Judgment and thought content normal. Her mood appears anxious.  Anxious but consolable.       Assessment:     Recurrent pilonidal abscess s/p I&D, healing.     Plan:     Increase activity as tolerated to regular activity.  Do not push through pain.  Continue good hygiene around the area.  Cotton ball/gauze.  RTC 3 weeks to make sure that it continues to heal.  Diet as tolerated. Bowel regimen to avoid problems.  This is her second documented attack by me.  I think she is going to require a more formal excision and closure to remove the chronic scar tissue.  I would like to wait for this infection to heal before attempting closure over drains.  The anatomy of the intragluteal cleft was discussed. Pathophysiology of pilonidal disease was discussed. The importance of keeping hairs trimmed to avoid recurrence was discussed. Discussion of options such as curretage, excision with closure vs leaving  open was discussed. Risks of infection and need for incision and drainage & antibiotics were discussed.  I noted a good likelihood this will help address the problem.    At this point, I think the patient would best served with considering surgery to excise the diseased tissue. I will make an attempt to close but it may need to be left open to allow it to heal with secondary intention and wound packing. Possible recurrences involve muscle flaps or different techniques were discussed as well. I noted that recurrence is higher with poor compliance on care maintenance and hygiene. The patient's questions were answered. The patient will think about it.  She is more inclined to doing surgery but wishes to discuss with her mother to coordinate care of her child.

## 2013-02-28 NOTE — Telephone Encounter (Signed)
Called [facility] to find out about a culture result that we sent off on 02/14/13. Our records show the courier picked up the specimen from our office on 02/14/13 at 4:33 with the initial GB on our records. Harland German is going to have to get in touch with the courier to see what happened to our culture b/c there is no results to report. They will get back in touch with me.

## 2013-03-01 ENCOUNTER — Telehealth (INDEPENDENT_AMBULATORY_CARE_PROVIDER_SITE_OTHER): Payer: Self-pay | Admitting: General Surgery

## 2013-03-01 NOTE — Telephone Encounter (Signed)
Pt called to clarify removal of cotton ball dressing while showering.  After review of discharge instructions, explained to her that removing the cotton ball will be easily facilitated while in the shower after it becomes saturated with water.  She understands and will try this.

## 2013-03-05 NOTE — Telephone Encounter (Signed)
Called [facility] to ask about the missing wound culture from 02/14/13 b/c I never got a return call from them last week. [facility] did a search a can not find the culture that Dr Michaell Cowing sent on the pt on 02/14/13. [facility] just apologized but said there is nothing that they  can do about the missing culture. The pt did not get charged for the culture since they never processed it. I will notify Dr Michaell Cowing.

## 2013-03-05 NOTE — Telephone Encounter (Signed)
Shame

## 2013-03-27 ENCOUNTER — Encounter (HOSPITAL_COMMUNITY): Payer: Self-pay | Admitting: *Deleted

## 2013-03-27 ENCOUNTER — Emergency Department (HOSPITAL_COMMUNITY)
Admission: EM | Admit: 2013-03-27 | Discharge: 2013-03-27 | Disposition: A | Discharge status: Home / Self Care | Payer: Managed Care, Other (non HMO) | Source: Home / Self Care | Attending: Family Medicine | Admitting: Family Medicine

## 2013-03-27 DIAGNOSIS — K529 Noninfective gastroenteritis and colitis, unspecified: Secondary | ICD-10-CM

## 2013-03-27 DIAGNOSIS — K5289 Other specified noninfective gastroenteritis and colitis: Secondary | ICD-10-CM

## 2013-03-27 LAB — POCT URINALYSIS DIP (DEVICE)
Bilirubin Urine: NEGATIVE
Glucose, UA: NEGATIVE mg/dL
Leukocytes, UA: NEGATIVE
Nitrite: NEGATIVE
Urobilinogen, UA: 0.2 mg/dL (ref 0.0–1.0)
pH: 6.5 (ref 5.0–8.0)

## 2013-03-27 MED ORDER — LOPERAMIDE HCL 2 MG PO CAPS: 2.0000 mg | ORAL_CAPSULE | Freq: Four times a day (QID) | ORAL | Status: DC | PRN

## 2013-03-27 MED ORDER — OMEPRAZOLE 20 MG PO CPDR: 20.0000 mg | DELAYED_RELEASE_CAPSULE | Freq: Every day | ORAL | Status: DC

## 2013-03-27 MED ORDER — ONDANSETRON 4 MG PO TBDP
ORAL_TABLET | ORAL | Status: AC
  Filled 2013-03-27: qty 1

## 2013-03-27 MED ORDER — ONDANSETRON 4 MG PO TBDP: 4.0000 mg | ORAL_TABLET | Freq: Three times a day (TID) | ORAL | Status: DC | PRN

## 2013-03-27 MED ORDER — ONDANSETRON 4 MG PO TBDP
4.0000 mg | ORAL_TABLET | Freq: Once | ORAL | Status: AC
  Administered 2013-03-27: 4 mg via ORAL

## 2013-03-27 NOTE — ED Notes (Signed)
Gatorade given. 

## 2013-03-27 NOTE — ED Provider Notes (Signed)
History     CSN: [id number]  Arrival date & time 03/27/13  1843   First MD Initiated Contact with Patient 03/27/13 1854      Chief Complaint  Patient presents with  . Emesis    (Consider location/radiation/quality/duration/timing/severity/associated sxs/prior treatment) HPI Comments: 27 year old female status post ventral hernia surgical laparoscopic repair in 2012. Here complaining of nausea vomiting and diarrhea since last night. Patient states her last meal was a [employer] fish sandwich. Denies abdominal pain. No hematemesis. No melena. Has vomited around 15 times since last night. Last emesis was over 4 hours ago. No dizziness or headache. No fever but has had chills. No dysuria. Feels weak. Mother with similar symptoms 3 days ago. Has had about 5 episodes of runny stools with no blood or mucus. Pregnancy test negative. Patient reports has not had sexual activity in last 8 months.   Past Medical History  Diagnosis Date  . Ventral hernia   . Pilonidal sinus with abscess April2013    drained [id number]    Past Surgical History  Procedure Laterality Date  . Incise and drain abcess  03/24/12    pilonidal abscess  . Ventral hernia repair  05/2012    lap supraumb VWH repair  . Hernia repair  June 2013    laparscopic    Family History  Problem Relation Age of Onset  . Hyperlipidemia Father   . Cancer Maternal Aunt     breast  . Hypertension Maternal Grandmother   . Hypertension Paternal Grandmother   . Diabetes Paternal Grandmother   . Thyroid disease Mother     History  Substance Use Topics  . Smoking status: Current Every Day Smoker -- 0.20 packs/day    Types: Cigarettes  . Smokeless tobacco: Never Used  . Alcohol Use: 0.6 oz/week    1 Glasses of wine per week    OB History   Grav Para Term Preterm Abortions TAB SAB Ect Mult Living                  Review of Systems  Constitutional: Positive for chills, appetite change and fatigue. Negative for fever and  diaphoresis.  HENT: Negative for congestion and sinus pressure.   Respiratory: Negative for cough, shortness of breath and wheezing.   Cardiovascular: Negative for chest pain.  Gastrointestinal: Positive for nausea, vomiting and diarrhea. Negative for abdominal pain.  Genitourinary: Negative for dysuria, frequency, hematuria and pelvic pain.  Neurological: Negative for dizziness and headaches.  All other systems reviewed and are negative.    Allergies  Codeine and Penicillins  Home Medications   Current Outpatient Rx  Name  Route  Sig  Dispense  Refill  . Vitamin D, Ergocalciferol, (DRISDOL) 50000 UNITS CAPS   Oral   Take 50,000 Units by mouth 2 (two) times a week. Not sure of the units but takes 2 times/ week.         . loperamide (IMODIUM) 2 MG capsule   Oral   Take 1 capsule (2 mg total) by mouth 4 (four) times daily as needed for diarrhea or loose stools.   12 capsule   0   . omeprazole (PRILOSEC) 20 MG capsule   Oral   Take 1 capsule (20 mg total) by mouth daily.   30 capsule   0   . ondansetron (ZOFRAN-ODT) 4 MG disintegrating tablet   Oral   Take 1 tablet (4 mg total) by mouth every 8 (eight) hours as needed for nausea.   10 tablet  0     BP 131/92  Pulse 101  Temp(Src) 98.9 F (37.2 C) (Oral)  Resp 16  SpO2 100%  LMP 03/08/2013  Physical Exam  Nursing note and vitals reviewed. Constitutional: She is oriented to person, place, and time. She appears well-developed and well-nourished. No distress.  HENT:  Head: Normocephalic and atraumatic.  Right Ear: External ear normal.  Left Ear: External ear normal.  Nose: Nose normal.  Mouth/Throat: No oropharyngeal exudate.  Dry lips and mucous membranes  Eyes: Conjunctivae and EOM are normal. Pupils are equal, round, and reactive to light. No scleral icterus.  Neck: Neck supple. No thyromegaly present.  Cardiovascular: Normal rate, regular rhythm, normal heart sounds and intact distal pulses.  Exam reveals  no gallop and no friction rub.   No murmur heard. Pulmonary/Chest: Effort normal and breath sounds normal.  Abdominal: Soft. Bowel sounds are normal. She exhibits no distension and no mass. There is no tenderness. There is no rebound and no guarding.  Lymphadenopathy:    She has no cervical adenopathy.  Neurological: She is alert and oriented to person, place, and time.  Skin: No rash noted. She is not diaphoretic.  No cutaneous folds    ED Course  Procedures (including critical care time)  Labs Reviewed  POCT URINALYSIS DIP (DEVICE) - Abnormal; Notable for the following:    Ketones, ur 40 (*)    Hgb urine dipstick TRACE (*)    Protein, ur >=300 (*)    All other components within normal limits  POCT PREGNANCY, URINE   No results found.   1. Gastroenteritis       MDM  Patient here orthostatic vital signs and clinical signs of moderate dehydration. Benign abdominal examination. Nausea resolved after ondansetron 4 mg sublingual. Patient tolerating low-calorie Gatorade with no emesis here. Pupils comfortable to continue oral hydration at home. Prescribed ondansetron, Imodium and Prilosec. Supportive care and red flags that should prompt her return to medical attention discussed in detail with patient and provided in writing. Specifically patient was asked to go to the emergency department if recurrent vomiting despite following treatment.        Sharin Grave, MD 03/27/13 2124

## 2013-03-27 NOTE — ED Notes (Addendum)
When she got home from work last night @ 2200, she nausea. She vomited  X 5 before MN.  C/o vomiting today 15 times.  Diarrhea x 4-5 times.  Upper abdominal soreness after she vomits.  Feels like everything is weak and nauseated. Has not had sex in 8 mos. and knows she is not pregnant.  Her Mom has been sick with the same thing on Sunday.   She has had chills and sweats.

## 2013-03-28 ENCOUNTER — Ambulatory Visit (INDEPENDENT_AMBULATORY_CARE_PROVIDER_SITE_OTHER): Payer: Managed Care, Other (non HMO) | Admitting: Surgery

## 2013-03-28 ENCOUNTER — Encounter (INDEPENDENT_AMBULATORY_CARE_PROVIDER_SITE_OTHER): Payer: Self-pay | Admitting: Surgery

## 2013-03-28 VITALS — BP 110/78 | HR 70 | Resp 18 | Ht 66.0 in | Wt 141.0 lb

## 2013-03-28 DIAGNOSIS — L0501 Pilonidal cyst with abscess: Secondary | ICD-10-CM

## 2013-03-28 NOTE — Progress Notes (Signed)
Subjective:     Patient ID: Crystal Wolfe, female   DOB: October 27, 1986, 27 y.o.   MRN: [id number]  HPI   Crystal Wolfe  1986-01-11 [id number]  Patient Care Team: Merlene Laughter. Renae Gloss, MD as PCP - General (Internal Medicine)  This patient is a 27 y.o.female who presents today for surgical evaluation at the request of self.   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  Had a periumbilical ventral hernias x2.  Laparoscopically repaired by me in 2013 after trying to get her to do it in 2008 & 2011.    Just before surgery, she developed pilonidal abscess.  I drained it.  She recovered relatively quickly from that & then had surgery for the hernias.  She has had history of pilonidal  pain and there but never true drainage before that.  She began to feel pain underneath her lump.  Has not tried antibiotics.   I drained an abscess last visit .  Since then, she is feeling better.  The wound is closed down.  No more drainage.  Pain is gone.  Patient Active Problem List  Diagnosis  . Tobacco dependence  . Pilonidal abscess s/p I&D 2013 & 2014    Past Medical History  Diagnosis Date  . Ventral hernia   . Pilonidal sinus with abscess April2013    drained [id number]    Past Surgical History  Procedure Laterality Date  . Incise and drain abcess  03/24/12    pilonidal abscess  . Ventral hernia repair  05/2012    lap supraumb VWH repair  . Hernia repair  June 2013    laparscopic    History   Social History  . Marital Status: Single    Spouse Name: N/A    Number of Children: N/A  . Years of Education: N/A   Occupational History  . Not on file.   Social History Main Topics  . Smoking status: Current Every Day Smoker -- 0.20 packs/day    Types: Cigarettes  . Smokeless tobacco: Never Used  . Alcohol Use: 0.6 oz/week    1 Glasses of wine per week  . Drug Use: 7.00 per week    Special: Marijuana  . Sexually Active: Yes    Birth Control/  Protection: None   Other Topics Concern  . Not on file   Social History Narrative  . No narrative on file    Family History  Problem Relation Age of Onset  . Hyperlipidemia Father   . Cancer Maternal Aunt     breast  . Hypertension Maternal Grandmother   . Hypertension Paternal Grandmother   . Diabetes Paternal Grandmother   . Thyroid disease Mother     Current Outpatient Prescriptions  Medication Sig Dispense Refill  . loperamide (IMODIUM) 2 MG capsule Take 1 capsule (2 mg total) by mouth 4 (four) times daily as needed for diarrhea or loose stools.  12 capsule  0  . omeprazole (PRILOSEC) 20 MG capsule Take 1 capsule (20 mg total) by mouth daily.  30 capsule  0  . ondansetron (ZOFRAN-ODT) 4 MG disintegrating tablet Take 1 tablet (4 mg total) by mouth every 8 (eight) hours as needed for nausea.  10 tablet  0  . Vitamin D, Ergocalciferol, (DRISDOL) 50000 UNITS CAPS Take 50,000 Units by mouth 2 (two) times a week. Not sure of the units but takes 2 times/ week.       No current facility-administered medications for this visit.  Allergies  Allergen Reactions  . Codeine Itching and Nausea Only  . Penicillins Itching and Nausea And Vomiting    ALL CILLIN drugs!!    BP 110/78  Pulse 70  Resp 18  Ht 5\' 6"  (1.676 m)  Wt 141 lb (63.957 kg)  BMI 22.77 kg/m2  LMP 03/08/2013  No results found.   Review of Systems  Constitutional: Negative for fever, chills, diaphoresis, appetite change and fatigue.  HENT: Negative for ear pain, sore throat, trouble swallowing, neck pain and ear discharge.   Eyes: Negative for photophobia, discharge and visual disturbance.  Respiratory: Negative for cough, choking, chest tightness and shortness of breath.   Cardiovascular: Negative for chest pain and palpitations.  Gastrointestinal: Negative for nausea, vomiting, abdominal pain, diarrhea, constipation, anal bleeding and rectal pain.  Genitourinary: Negative for dysuria, frequency and  difficulty urinating.  Musculoskeletal: Negative for myalgias and gait problem.  Skin: Positive for wound. Negative for color change, pallor and rash.  Neurological: Negative for dizziness, speech difficulty, weakness and numbness.  Hematological: Negative for adenopathy.  Psychiatric/Behavioral: Negative for confusion and agitation. The patient is not nervous/anxious.        Objective:   Physical Exam  Constitutional: She is oriented to person, place, and time. She appears well-developed and well-nourished. No distress.  HENT:  Head: Normocephalic.  Mouth/Throat: Oropharynx is clear and moist. No oropharyngeal exudate.  Eyes: Conjunctivae and EOM are normal. Pupils are equal, round, and reactive to light. No scleral icterus.  Neck: Normal range of motion. Neck supple. No tracheal deviation present.  Cardiovascular: Normal rate, regular rhythm and intact distal pulses.   Pulmonary/Chest: Effort normal and breath sounds normal. No respiratory distress. She exhibits no tenderness.  Abdominal: Soft. She exhibits no distension and no mass. There is no tenderness. Hernia confirmed negative in the right inguinal area and confirmed negative in the left inguinal area.  Genitourinary: No vaginal discharge found.  Musculoskeletal: Normal range of motion. She exhibits no tenderness.       Back:  Lymphadenopathy:    She has no cervical adenopathy.       Right: No inguinal adenopathy present.       Left: No inguinal adenopathy present.  Neurological: She is alert and oriented to person, place, and time. No cranial nerve deficit. She exhibits normal muscle tone. Coordination normal.  Skin: Skin is warm and dry. No rash noted. She is not diaphoretic. No erythema.  Psychiatric: Her behavior is normal. Judgment and thought content normal. Her mood appears anxious.  Anxious but consolable.       Assessment:     Recurrent pilonidal abscess s/p I&D, healing.     Plan:     Increase activity as  tolerated to regular activity.  Do not push through pain.  Continue good hygiene around the area.  Cotton ball/gauze.  RTC 3 weeks to make sure that it continues to heal.  Diet as tolerated. Bowel regimen to avoid problems.  This is her second documented attack by me.  I think she is going to require a more formal excision and closure to remove the chronic scar tissue.  I would like to wait for this infection to heal before attempting closure over drains.  The anatomy of the intragluteal cleft was discussed. Pathophysiology of pilonidal disease was discussed. The importance of keeping hairs trimmed to avoid recurrence was discussed. Discussion of options such as curretage, excision with closure vs leaving open was discussed. Risks of infection and need for incision and  drainage & antibiotics were discussed.  I noted a good likelihood this will help address the problem.    At this point, I think the patient would best served with considering surgery to excise the diseased tissue. I will make an attempt to close but it may need to be left open to allow it to heal with secondary intention and wound packing. Possible recurrences involve muscle flaps or different techniques were discussed as well. I noted that recurrence is higher with poor compliance on care maintenance and hygiene. The patient's questions were answered. The patient will think about it.  She is more inclined to doing surgery but wishes to wait a few motnhs so that she can block time.

## 2013-03-28 NOTE — Patient Instructions (Addendum)
See the Handout(s) we gave you.  Consider surgery.  Please call our office at [phone] if you wish to schedule surgery or if you have further questions / concerns.    Pilonidal Cyst A pilonidal cyst occurs when hairs get trapped (ingrown) beneath the skin in the crease between the buttocks over your sacrum (the bone under that crease). Pilonidal cysts are most common in young men with a lot of body hair. When the cyst is ruptured (breaks) or leaking, fluid from the cyst may cause burning and itching. If the cyst becomes infected, it causes a painful swelling filled with pus (abscess). The pus and trapped hairs need to be removed (often by lancing) so that the infection can heal. However, recurrence is common and an operation may be needed to remove the cyst. HOME CARE INSTRUCTIONS   If the cyst was NOT INFECTED:  Keep the area clean and dry. Bathe or shower daily. Wash the area well with a germ-killing soap. Warm tub baths may help prevent infection and help with drainage. Dry the area well with a towel.  Avoid tight clothing to keep area as moisture free as possible.  Keep area between buttocks as free of hair as possible. A depilatory may be used.  If the cyst WAS INFECTED and needed to be drained:  Your caregiver packed the wound with gauze to keep the wound open. This allows the wound to heal from the inside outwards and continue draining.  Return for a wound check in 1 day or as suggested.  If you take tub baths or showers, repack the wound with gauze following them. Sponge baths (at the sink) are a good alternative.  If an antibiotic was ordered to fight the infection, take as directed.  Only take over-the-counter or prescription medicines for pain, discomfort, or fever as directed by your caregiver.  After the drain is removed, use sitz baths for 20 minutes 4 times per day. Clean the wound gently with mild unscented soap, pat dry, and then apply a dry dressing. SEEK MEDICAL  CARE IF:   You have increased pain, swelling, redness, drainage, or bleeding from the area.  You have a fever.  You have muscles aches, dizziness, or a general ill feeling. Document Released: 11/19/2000 Document Revised: 02/14/2012 Document Reviewed: 01/17/2009 [facility] Patient Information 2013 [place], [place].

## 2013-03-29 ENCOUNTER — Encounter (HOSPITAL_COMMUNITY): Payer: Self-pay | Admitting: Emergency Medicine

## 2013-03-29 ENCOUNTER — Emergency Department (HOSPITAL_COMMUNITY): Payer: Managed Care, Other (non HMO)

## 2013-03-29 ENCOUNTER — Emergency Department (HOSPITAL_COMMUNITY)
Admission: EM | Admit: 2013-03-29 | Discharge: 2013-03-29 | Disposition: A | Discharge status: Home / Self Care | Payer: Managed Care, Other (non HMO) | Attending: Emergency Medicine | Admitting: Emergency Medicine

## 2013-03-29 DIAGNOSIS — Z8719 Personal history of other diseases of the digestive system: Secondary | ICD-10-CM | POA: Insufficient documentation

## 2013-03-29 DIAGNOSIS — R112 Nausea with vomiting, unspecified: Secondary | ICD-10-CM | POA: Insufficient documentation

## 2013-03-29 DIAGNOSIS — Z872 Personal history of diseases of the skin and subcutaneous tissue: Secondary | ICD-10-CM | POA: Insufficient documentation

## 2013-03-29 DIAGNOSIS — Z880 Allergy status to penicillin: Secondary | ICD-10-CM | POA: Insufficient documentation

## 2013-03-29 DIAGNOSIS — R109 Unspecified abdominal pain: Secondary | ICD-10-CM | POA: Insufficient documentation

## 2013-03-29 DIAGNOSIS — R748 Abnormal levels of other serum enzymes: Secondary | ICD-10-CM | POA: Insufficient documentation

## 2013-03-29 DIAGNOSIS — Z79899 Other long term (current) drug therapy: Secondary | ICD-10-CM | POA: Insufficient documentation

## 2013-03-29 DIAGNOSIS — Z9889 Other specified postprocedural states: Secondary | ICD-10-CM | POA: Insufficient documentation

## 2013-03-29 DIAGNOSIS — F17200 Nicotine dependence, unspecified, uncomplicated: Secondary | ICD-10-CM | POA: Insufficient documentation

## 2013-03-29 LAB — COMPREHENSIVE METABOLIC PANEL
ALT: 13 U/L (ref 0–35)
AST: 33 U/L (ref 0–37)
Albumin: 4.8 g/dL (ref 3.5–5.2)
Alkaline Phosphatase: 67 U/L (ref 39–117)
Calcium: 10.3 mg/dL (ref 8.4–10.5)
Glucose, Bld: 91 mg/dL (ref 70–99)
Potassium: 3.6 mEq/L (ref 3.5–5.1)
Sodium: 137 mEq/L (ref 135–145)
Total Protein: 8.6 g/dL — ABNORMAL HIGH (ref 6.0–8.3)

## 2013-03-29 LAB — RAPID URINE DRUG SCREEN, HOSP PERFORMED
Barbiturates: NOT DETECTED
Benzodiazepines: NOT DETECTED
Cocaine: NOT DETECTED
Opiates: NOT DETECTED
Tetrahydrocannabinol: POSITIVE — AB

## 2013-03-29 LAB — CBC WITH DIFFERENTIAL/PLATELET
Basophils Absolute: 0 10*3/uL (ref 0.0–0.1)
Basophils Relative: 0 % (ref 0–1)
Eosinophils Absolute: 0 10*3/uL (ref 0.0–0.7)
Lymphs Abs: 2.6 10*3/uL (ref 0.7–4.0)
MCH: 29.5 pg (ref 26.0–34.0)
Neutrophils Relative %: 62 % (ref 43–77)
Platelets: 314 10*3/uL (ref 150–400)
RBC: 4.84 MIL/uL (ref 3.87–5.11)
RDW: 12 % (ref 11.5–15.5)

## 2013-03-29 LAB — URINALYSIS, ROUTINE W REFLEX MICROSCOPIC
Bilirubin Urine: NEGATIVE
Glucose, UA: NEGATIVE mg/dL
Hgb urine dipstick: NEGATIVE
Specific Gravity, Urine: >1.046 — ABNORMAL HIGH (ref 1.005–1.030)
pH: 7 (ref 5.0–8.0)

## 2013-03-29 MED ORDER — SODIUM CHLORIDE 0.9 % IV SOLN: 1000.0000 mL | INTRAVENOUS | Status: DC

## 2013-03-29 MED ORDER — SODIUM CHLORIDE 0.9 % IV BOLUS (SEPSIS)
1000.0000 mL | Freq: Once | INTRAVENOUS | Status: AC
  Administered 2013-03-29: 1000 mL via INTRAVENOUS

## 2013-03-29 MED ORDER — ONDANSETRON HCL 4 MG/2ML IJ SOLN
4.0000 mg | Freq: Once | INTRAMUSCULAR | Status: AC
  Administered 2013-03-29: 4 mg via INTRAVENOUS
  Filled 2013-03-29: qty 2

## 2013-03-29 MED ORDER — LORAZEPAM 2 MG/ML IJ SOLN
1.0000 mg | Freq: Once | INTRAMUSCULAR | Status: AC
  Administered 2013-03-29: 1 mg via INTRAVENOUS
  Filled 2013-03-29: qty 1

## 2013-03-29 MED ORDER — IOHEXOL 300 MG/ML  SOLN
100.0000 mL | Freq: Once | INTRAMUSCULAR | Status: AC | PRN
  Administered 2013-03-29: 100 mL via INTRAVENOUS

## 2013-03-29 MED ORDER — IOHEXOL 300 MG/ML  SOLN
50.0000 mL | Freq: Once | INTRAMUSCULAR | Status: AC | PRN
  Administered 2013-03-29: 50 mL via ORAL

## 2013-03-29 MED ORDER — LORAZEPAM 1 MG PO TABS: 1.0000 mg | ORAL_TABLET | Freq: Four times a day (QID) | ORAL | Status: DC | PRN

## 2013-03-29 MED ORDER — SODIUM CHLORIDE 0.9 % IV SOLN
1000.0000 mL | Freq: Once | INTRAVENOUS | Status: AC
  Administered 2013-03-29: 1000 mL via INTRAVENOUS

## 2013-03-29 NOTE — ED Notes (Signed)
Pt states she has been having emesis since Monday, unable to keep any fluids down.  Pt states she had diarrhea on Monday, but none since.  Pt states she has upper abd pain as well.  Threw up 4x in past 24 hours.  Had laproscopic hernia repair last June.

## 2013-03-29 NOTE — ED Provider Notes (Signed)
Medical screening examination/treatment/procedure(s) were performed by non-physician practitioner and as supervising physician I was immediately available for consultation/collaboration.   Benny Lennert, MD 03/29/13 1537

## 2013-03-29 NOTE — ED Provider Notes (Signed)
History     CSN: [id number]  Arrival date & time 03/29/13  5621   First MD Initiated Contact with Patient 03/29/13 [phone]      Chief Complaint  Patient presents with  . Emesis  . Abdominal Pain    (Consider location/radiation/quality/duration/timing/severity/associated sxs/prior treatment) HPI  27 year old female presents emergency Department with chief complaint of nausea and vomiting, diarrhea.  Patient states that this began last Monday.  She was seen on Tuesday at [facility] urgent care and prescribed Zofran and loperamide for her diarrhea.  The patient had a negative urine pregnancy.  UA showed a high amount of protein, ketonuria and trace hemoglobin.  The patient states that she has had little relief of her nausea and vomiting with Zofran.  She continues to vomit most every hour.  She denies any abdominal pain but does complain of hyperactive bowel activity and tenesmus intermittently.  She has had no more episodes of diarrhea.  Patient has been unable to vomit and has been retching as she is dehydrated.  She is has had intermittent chills and myalgias.  However is afebrile.  Patient denies any melena or hematochezia.  She denies any bilious or bloody vomitus.She denies contacts with similar sxs, ingestion of suspect foods or water, history of similar sxs, recent foreign travel .  sHe has a past surgical history of laparoscopic ventral hernia repair.  Past Medical History  Diagnosis Date  . Ventral hernia   . Pilonidal sinus with abscess April2013    drained [id number]    Past Surgical History  Procedure Laterality Date  . Incise and drain abcess  03/24/12    pilonidal abscess  . Ventral hernia repair  05/2012    lap supraumb VWH repair  . Hernia repair  June 2013    laparscopic    Family History  Problem Relation Age of Onset  . Hyperlipidemia Father   . Cancer Maternal Aunt     breast  . Hypertension Maternal Grandmother   . Hypertension Paternal Grandmother   .  Diabetes Paternal Grandmother   . Thyroid disease Mother     History  Substance Use Topics  . Smoking status: Current Every Day Smoker -- 0.20 packs/day    Types: Cigarettes  . Smokeless tobacco: Never Used  . Alcohol Use: 0.6 oz/week    1 Glasses of wine per week    OB History   Grav Para Term Preterm Abortions TAB SAB Ect Mult Living                  Review of Systems Ten systems reviewed and are negative for acute change, except as noted in the HPI.   Allergies  Codeine and Penicillins  Home Medications   Current Outpatient Rx  Name  Route  Sig  Dispense  Refill  . loperamide (IMODIUM) 2 MG capsule   Oral   Take 1 capsule (2 mg total) by mouth 4 (four) times daily as needed for diarrhea or loose stools.   12 capsule   0   . omeprazole (PRILOSEC) 20 MG capsule   Oral   Take 1 capsule (20 mg total) by mouth daily.   30 capsule   0   . ondansetron (ZOFRAN-ODT) 4 MG disintegrating tablet   Oral   Take 1 tablet (4 mg total) by mouth every 8 (eight) hours as needed for nausea.   10 tablet   0   . Vitamin D, Ergocalciferol, (DRISDOL) 50000 UNITS CAPS   Oral  Take 50,000 Units by mouth 2 (two) times a week. Not sure of the units but takes 2 times/ week.           BP 136/73  Pulse 65  Temp(Src) 98.7 F (37.1 C) (Oral)  Resp 18  SpO2 100%  LMP 03/08/2013  Physical Exam Physical Exam  Nursing note and vitals reviewed. vital signs stable and afebrile Constitutional: She is oriented to person, place, and time. She appears well-developed and well-nourished. No distress.  Appears uncomfortable HENT:  Head: Normocephalic and atraumatic.  Eyes: Conjunctivae normal and EOM are normal. Pupils are equal, round, and reactive to light. No scleral icterus.  Neck: Normal range of motion.  Cardiovascular: Normal rate, regular rhythm and normal heart sounds.  Exam reveals no gallop and no friction rub.   No murmur heard. Pulmonary/Chest: Effort normal and breath  sounds normal. No respiratory distress.  Abdominal: Soft. Bowel sounds are hyperactive. She exhibits no distension and no mass. There is no tenderness.  She is uncomfortable with palpation of the abdomen which increases her nausea.  Neurological: She is alert and oriented to person, place, and time.  Skin: Skin is warm and dry. She is not diaphoretic.    ED Course  Procedures (including critical care time)  Labs Reviewed  CBC WITH DIFFERENTIAL  COMPREHENSIVE METABOLIC PANEL  LIPASE, BLOOD  URINALYSIS, ROUTINE W REFLEX MICROSCOPIC  URINE RAPID DRUG SCREEN (HOSP PERFORMED)   No results found.   1. Nausea & vomiting   2. Elevated lipase       MDM  9:09 AM Filed Vitals:   03/29/13 0823  BP: 136/73  Pulse: 65  Temp: 98.7 F (37.1 C)  Resp: 18   Patient here for continued vomiting and nausea without relief of symptoms with Zofran.  Established IV access.  Obtain labs.  I will not repeat a urine pregnancy but I will repeat a urine to check her protein levels as well as look for infection as she did have trace hemoglobinuria 2 days ago.  Differential includes gastroenteritis, cholelithiasis, gastroparesis, bowel obstruction.  Currently obtaining flatplate of the abdomen to rule out obstruction or perforation.   10:09 AM Patient continues to c/o nausea but states that nausea is reduced and asks for fluids to drink or ice.  I have ordered CT abdomen. Patient has elevated Lipase. Denies alcohol abuse.      11:42 PM BP 136/73  Pulse 65  Temp(Src) 98.7 F (37.1 C) (Oral)  Resp 18  SpO2 100%  LMP 03/08/2013 Patient with continued nausea.  Reduced her medication.  She is currently undergoing a by mouth challenge.  I discussed the case with Dr. Doreatha Martin and he is feels comfortable discharging the patient if she is able to tolerate fluids.  CT scan shows no acute abnormalities. 1:44 PM Filed Vitals:   03/29/13 0823  BP: 136/73  Pulse: 65  Temp: 98.7 F (37.1 C)  TempSrc: Oral   Resp: 18  SpO2: 100%   Patient has tolerated 12 ounces of fluid and kept it down.  Discharging the patient with Ativan to supplement her Zofran.  The patient should followup at urgent care or with her PCP in one week for repeat of her lipase level.  She may return to emergency Department if her symptoms recurred she is unable to hold down fluids and food.  Arthor Captain, PA-C 03/29/13 1419

## 2013-04-01 ENCOUNTER — Observation Stay (HOSPITAL_COMMUNITY)
Admission: EM | Admit: 2013-04-01 | Discharge: 2013-04-02 | Disposition: A | Discharge status: Home / Self Care | Payer: Managed Care, Other (non HMO) | Attending: Internal Medicine | Admitting: Internal Medicine

## 2013-04-01 ENCOUNTER — Encounter (HOSPITAL_COMMUNITY): Payer: Self-pay | Admitting: Emergency Medicine

## 2013-04-01 DIAGNOSIS — Z79899 Other long term (current) drug therapy: Secondary | ICD-10-CM | POA: Insufficient documentation

## 2013-04-01 DIAGNOSIS — R197 Diarrhea, unspecified: Secondary | ICD-10-CM | POA: Insufficient documentation

## 2013-04-01 DIAGNOSIS — K5289 Other specified noninfective gastroenteritis and colitis: Principal | ICD-10-CM | POA: Insufficient documentation

## 2013-04-01 DIAGNOSIS — R1115 Cyclical vomiting syndrome unrelated to migraine: Secondary | ICD-10-CM

## 2013-04-01 DIAGNOSIS — E860 Dehydration: Secondary | ICD-10-CM

## 2013-04-01 DIAGNOSIS — R1110 Vomiting, unspecified: Secondary | ICD-10-CM

## 2013-04-01 DIAGNOSIS — R748 Abnormal levels of other serum enzymes: Secondary | ICD-10-CM | POA: Insufficient documentation

## 2013-04-01 DIAGNOSIS — L0501 Pilonidal cyst with abscess: Secondary | ICD-10-CM

## 2013-04-01 DIAGNOSIS — R112 Nausea with vomiting, unspecified: Secondary | ICD-10-CM | POA: Diagnosis present

## 2013-04-01 DIAGNOSIS — F17200 Nicotine dependence, unspecified, uncomplicated: Secondary | ICD-10-CM | POA: Insufficient documentation

## 2013-04-01 DIAGNOSIS — F411 Generalized anxiety disorder: Secondary | ICD-10-CM | POA: Insufficient documentation

## 2013-04-01 DIAGNOSIS — K529 Noninfective gastroenteritis and colitis, unspecified: Secondary | ICD-10-CM

## 2013-04-01 LAB — COMPREHENSIVE METABOLIC PANEL
Albumin: 4.5 g/dL (ref 3.5–5.2)
BUN: 11 mg/dL (ref 6–23)
CO2: 27 mEq/L (ref 19–32)
Chloride: 93 mEq/L — ABNORMAL LOW (ref 96–112)
Creatinine, Ser: 0.71 mg/dL (ref 0.50–1.10)
GFR calc Af Amer: >90 mL/min (ref >90)
GFR calc non Af Amer: >90 mL/min (ref >90)
Total Bilirubin: 1.3 mg/dL — ABNORMAL HIGH (ref 0.3–1.2)

## 2013-04-01 LAB — URINALYSIS, ROUTINE W REFLEX MICROSCOPIC
Ketones, ur: >80 mg/dL — AB
Leukocytes, UA: NEGATIVE
Nitrite: NEGATIVE
Protein, ur: NEGATIVE mg/dL
Urobilinogen, UA: 2 mg/dL — ABNORMAL HIGH (ref 0.0–1.0)

## 2013-04-01 LAB — CBC
HCT: 39.9 % (ref 36.0–46.0)
Hemoglobin: 14.1 g/dL (ref 12.0–15.0)
RDW: 11.4 % — ABNORMAL LOW (ref 11.5–15.5)
WBC: 5.6 10*3/uL (ref 4.0–10.5)

## 2013-04-01 LAB — LIPASE, BLOOD: Lipase: 36 U/L (ref 11–59)

## 2013-04-01 LAB — RAPID URINE DRUG SCREEN, HOSP PERFORMED: Barbiturates: NOT DETECTED

## 2013-04-01 MED ORDER — PROMETHAZINE HCL 25 MG/ML IJ SOLN
12.5000 mg | Freq: Four times a day (QID) | INTRAMUSCULAR | Status: DC | PRN
  Administered 2013-04-01: 12.5 mg via INTRAVENOUS
  Filled 2013-04-01: qty 1

## 2013-04-01 MED ORDER — SODIUM CHLORIDE 0.9 % IV SOLN: 1000.0000 mL | INTRAVENOUS | Status: DC

## 2013-04-01 MED ORDER — SODIUM CHLORIDE 0.9 % IV SOLN
1000.0000 mL | Freq: Once | INTRAVENOUS | Status: AC
  Administered 2013-04-01: 1000 mL via INTRAVENOUS

## 2013-04-01 MED ORDER — ENOXAPARIN SODIUM 40 MG/0.4ML ~~LOC~~ SOLN
40.0000 mg | SUBCUTANEOUS | Status: DC
Start: 2013-04-01 — End: 2013-04-02
  Administered 2013-04-01: 40 mg via SUBCUTANEOUS
  Filled 2013-04-01 (×2): qty 0.4

## 2013-04-01 MED ORDER — SODIUM CHLORIDE 0.9 % IV SOLN
INTRAVENOUS | Status: AC
  Administered 2013-04-01: 15:00:00 via INTRAVENOUS

## 2013-04-01 MED ORDER — METOCLOPRAMIDE HCL 5 MG/ML IJ SOLN
5.0000 mg | Freq: Four times a day (QID) | INTRAMUSCULAR | Status: DC
  Administered 2013-04-01 – 2013-04-02 (×2): 5 mg via INTRAVENOUS
  Filled 2013-04-01: qty 1
  Filled 2013-04-01 (×2): qty 2
  Filled 2013-04-01 (×3): qty 1

## 2013-04-01 MED ORDER — PANTOPRAZOLE SODIUM 40 MG IV SOLR
40.0000 mg | INTRAVENOUS | Status: DC
  Administered 2013-04-01: 40 mg via INTRAVENOUS
  Filled 2013-04-01 (×2): qty 40

## 2013-04-01 MED ORDER — POTASSIUM CHLORIDE IN NACL 20-0.9 MEQ/L-% IV SOLN
INTRAVENOUS | Status: DC
  Administered 2013-04-01 – 2013-04-02 (×3): via INTRAVENOUS
  Filled 2013-04-01 (×4): qty 1000

## 2013-04-01 MED ORDER — ONDANSETRON HCL 4 MG/2ML IJ SOLN
4.0000 mg | Freq: Once | INTRAMUSCULAR | Status: AC
  Administered 2013-04-01: 4 mg via INTRAVENOUS
  Filled 2013-04-01: qty 2

## 2013-04-01 MED ORDER — ONDANSETRON HCL 4 MG/2ML IJ SOLN
4.0000 mg | Freq: Four times a day (QID) | INTRAMUSCULAR | Status: DC | PRN
  Administered 2013-04-01 – 2013-04-02 (×2): 4 mg via INTRAVENOUS
  Filled 2013-04-01 (×2): qty 2

## 2013-04-01 MED ORDER — ONDANSETRON HCL 4 MG PO TABS
4.0000 mg | ORAL_TABLET | Freq: Four times a day (QID) | ORAL | Status: DC | PRN
  Administered 2013-04-02: 4 mg via ORAL
  Filled 2013-04-01: qty 1

## 2013-04-01 NOTE — ED Notes (Signed)
Pt does not make eye contact w/ staff and does not speak, mother answers all questions. Pt appears in no distress and has not vomited since arrival

## 2013-04-01 NOTE — ED Provider Notes (Signed)
Patient has had vomiting all week to the point now she is having dry heaves. She denies any abdominal pain. She did have diarrhea 6 days ago but not since. She states she feels weak and dizzy. Her mother states she seems very slow.  Patient is alert and cooperative she looks like she feels bad.  Medical screening examination/treatment/procedure(s) were conducted as a shared visit with non-physician practitioner(s) and myself.  I personally evaluated the patient during the encounter  Devoria Albe, MD, Franz Dell, MD 04/01/13 1351

## 2013-04-01 NOTE — ED Provider Notes (Signed)
See prior note   Ward Givens, MD 04/01/13 [phone]

## 2013-04-01 NOTE — H&P (Signed)
PCP:   [name]., MD   Chief Complaint:  Nausea, vomiting  HPI: 27 y/o female who started having nausea, vomiting and diarrhea which started on Monday after she had filet o fish sandwich from [employer]. She did drink one glass of vodka on Sunday. She was seen at the Urgent care and later was seen at the ED and was found to have mildly elevated lipase of 106, which went down to 36 today. She says that the diarrhea has resolved but she continues to have nausea,vomiting. She also had pilonidal cyst, which was drained by surgery as outpatient, and it has healed.  She denies chest pain, no shortness of breath, no fever, dysuria. Her urine pregnancy test is negative Allergies:   Allergies  Allergen Reactions  . Codeine Itching and Nausea Only  . Penicillins Itching and Nausea And Vomiting    ALL CILLIN drugs!!      Past Medical History  Diagnosis Date  . Ventral hernia   . Pilonidal sinus with abscess April2013    drained [id number]    Past Surgical History  Procedure Laterality Date  . Incise and drain abcess  03/24/12    pilonidal abscess  . Ventral hernia repair  05/2012    lap supraumb VWH repair  . Hernia repair  June 2013    laparscopic    Prior to Admission medications   Medication Sig Start Date End Date Taking? Authorizing Provider  LORazepam (ATIVAN) 1 MG tablet Take 1 tablet (1 mg total) by mouth every 6 (six) hours as needed for anxiety. 03/29/13  Yes Arthor Captain, PA-C  omeprazole (PRILOSEC) 20 MG capsule Take 1 capsule (20 mg total) by mouth daily. 03/27/13  Yes Adlih Moreno-Coll, MD  ondansetron (ZOFRAN-ODT) 4 MG disintegrating tablet Take 1 tablet (4 mg total) by mouth every 8 (eight) hours as needed for nausea. 03/27/13  Yes Adlih Moreno-Coll, MD  Vitamin D, Ergocalciferol, (DRISDOL) 50000 UNITS CAPS Take 50,000 Units by mouth 2 (two) times a week. Takes various times in the week.   Yes Historical Provider, MD  loperamide (IMODIUM) 2 MG capsule Take 1 capsule  (2 mg total) by mouth 4 (four) times daily as needed for diarrhea or loose stools. 03/27/13   Sharin Grave, MD    Social History:  reports that she has been smoking Cigarettes.  She has been smoking about 0.20 packs per day. She has never used smokeless tobacco. She reports that she drinks about 0.6 ounces of alcohol per week. She reports that she uses illicit drugs (Marijuana) about 7 times per week.  Family History  Problem Relation Age of Onset  . Hyperlipidemia Father   . Cancer Maternal Aunt     breast  . Hypertension Maternal Grandmother   . Hypertension Paternal Grandmother   . Diabetes Paternal Grandmother   . Thyroid disease Mother     Review of Systems:  HEENT: Denies headache, blurred vision, runny nose, sore throat,  Neck: Denies thyroid problems,lymphadenopathy Chest : Denies shortness of breath, no history of COPD Heart : Denies Chest pain,  coronary arterey disease GI: see HPI GU: Denies dysuria, urgency, frequency of urination, hematuria Neuro: Denies stroke, seizures, syncope Psych: Denies depression, anxiety, hallucinations   Physical Exam: Blood pressure 155/84, pulse 56, temperature 98.3 F (36.8 C), temperature source Oral, resp. rate 16, last menstrual period 03/08/2013, SpO2 100.00%. Constitutional:   Patient is a well-developed and well-nourished female in no acute distress and cooperative with exam. Head: Normocephalic and atraumatic Mouth: Mucus membranes  moist Eyes: PERRL, EOMI, conjunctivae normal Neck: Supple, No Thyromegaly Cardiovascular: RRR, S1 normal, S2 normal Pulmonary/Chest: CTAB, no wheezes, rales, or rhonchi Abdominal: Soft. Non-tender, non-distended, bowel sounds are normal, no masses, organomegaly, or guarding present.  Buttock: healed lesion noted on the left buttock Neurological: A&O x3, Strenght is normal and symmetric bilaterally, cranial nerve II-XII are grossly intact, no focal motor deficit, sensory intact to light touch  bilaterally.  Extremities : No Cyanosis, Clubbing or Edema   Labs on Admission:  Results for orders placed during the hospital encounter of 04/01/13 (from the past 48 hour(s))  URINALYSIS, ROUTINE W REFLEX MICROSCOPIC     Status: Abnormal   Collection Time    04/01/13 12:03 PM      Result Value Range   Color, Urine YELLOW  YELLOW   APPearance CLOUDY (*) CLEAR   Specific Gravity, Urine 1.019  1.005 - 1.030   pH 6.5  5.0 - 8.0   Glucose, UA NEGATIVE  NEGATIVE mg/dL   Hgb urine dipstick NEGATIVE  NEGATIVE   Bilirubin Urine SMALL (*) NEGATIVE   Ketones, ur >80 (*) NEGATIVE mg/dL   Protein, ur NEGATIVE  NEGATIVE mg/dL   Urobilinogen, UA 2.0 (*) 0.0 - 1.0 mg/dL   Nitrite NEGATIVE  NEGATIVE   Leukocytes, UA NEGATIVE  NEGATIVE   Comment: MICROSCOPIC NOT DONE ON URINES WITH NEGATIVE PROTEIN, BLOOD, LEUKOCYTES, NITRITE, OR GLUCOSE <1000 mg/dL.  PREGNANCY, URINE     Status: None   Collection Time    04/01/13 12:03 PM      Result Value Range   Preg Test, Ur NEGATIVE  NEGATIVE   Comment:            THE SENSITIVITY OF THIS     METHODOLOGY IS >20 mIU/mL.  COMPREHENSIVE METABOLIC PANEL     Status: Abnormal   Collection Time    04/01/13 12:30 PM      Result Value Range   Sodium 132 (*) 135 - 145 mEq/L   Potassium 3.6  3.5 - 5.1 mEq/L   Chloride 93 (*) 96 - 112 mEq/L   CO2 27  19 - 32 mEq/L   Glucose, Bld 81  70 - 99 mg/dL   BUN 11  6 - 23 mg/dL   Creatinine, Ser 9.56  0.50 - 1.10 mg/dL   Calcium 9.2  8.4 - 21.3 mg/dL   Total Protein 7.7  6.0 - 8.3 g/dL   Albumin 4.5  3.5 - 5.2 g/dL   AST 46 (*) 0 - 37 U/L   ALT 26  0 - 35 U/L   Alkaline Phosphatase 61  39 - 117 U/L   Total Bilirubin 1.3 (*) 0.3 - 1.2 mg/dL   GFR calc non Af Amer >90  >90 mL/min   GFR calc Af Amer >90  >90 mL/min   Comment:            The eGFR has been calculated     using the CKD EPI equation.     This calculation has not been     validated in all clinical     situations.     eGFR's persistently     <90  mL/min signify     possible Chronic Kidney Disease.  LIPASE, BLOOD     Status: None   Collection Time    04/01/13 12:30 PM      Result Value Range   Lipase 36  11 - 59 U/L    Radiological Exams on Admission: No results  found.  Assessment/Plan  Nausea, vomiting ? Pancreatitis  Pilonidal cyst  Nausea, Vomiting Sec to Gastroenteritis Patient will be admitted for IV fluids, antiemetics.  Will also start her on Reglan 5 mg IV q 6 hr  ? Pancreatitis Patient had mildly elevated lipase, which is now resolved. Will start clear liquid diet and advance the diet as tolerated.  Pilonidal cyst Healed.  DVT prophylaxis Lovenox  Time Spent on Admission: 65 min  Zuleyka Kloc S [facility] Pager: [phone] 04/01/2013, 2:24 PM

## 2013-04-01 NOTE — ED Provider Notes (Signed)
History     CSN: [id number]  Arrival date & time 04/01/13  1103   First MD Initiated Contact with Patient 04/01/13 1137      Chief Complaint  Patient presents with  . Emesis    (Consider location/radiation/quality/duration/timing/severity/associated sxs/prior treatment) HPI Comments: Crystal Wolfe is a 27 y.o. female with no significant past medical history presents emergency department complaining of hyperemesis.  Patient was evaluated 5 days ago and diagnosed with gastroenteritis, diarrhea ceased however emesis has continued.  Patient return to the emergency department 2 days later and was found to have an elevated lipase but normal CT exam.  Patient was discharged with Zofran, but emesis is refractory to antibiotics.  Mother reports that the patient cannot keep down Pedialyte, chicken broth, or simple boiled water.  Patient denies any abdominal pain, melena, hematemesis, hematochezia, diarrhea, fever, night sweats, or chills.  Associated symptoms include fatigue and generalized weakness.  Symptoms are moderate.  No known exacerbating or alleviating factors.  The history is provided by the patient.    Past Medical History  Diagnosis Date  . Ventral hernia   . Pilonidal sinus with abscess April2013    drained [id number]    Past Surgical History  Procedure Laterality Date  . Incise and drain abcess  03/24/12    pilonidal abscess  . Ventral hernia repair  05/2012    lap supraumb VWH repair  . Hernia repair  June 2013    laparscopic    Family History  Problem Relation Age of Onset  . Hyperlipidemia Father   . Cancer Maternal Aunt     breast  . Hypertension Maternal Grandmother   . Hypertension Paternal Grandmother   . Diabetes Paternal Grandmother   . Thyroid disease Mother     History  Substance Use Topics  . Smoking status: Current Every Day Smoker -- 0.20 packs/day    Types: Cigarettes  . Smokeless tobacco: Never Used  . Alcohol Use: 0.6 oz/week    1 Glasses of  wine per week    OB History   Grav Para Term Preterm Abortions TAB SAB Ect Mult Living                  Review of Systems  All other systems reviewed and are negative.    Allergies  Codeine and Penicillins  Home Medications   Current Outpatient Rx  Name  Route  Sig  Dispense  Refill  . LORazepam (ATIVAN) 1 MG tablet   Oral   Take 1 tablet (1 mg total) by mouth every 6 (six) hours as needed for anxiety.   10 tablet   0   . omeprazole (PRILOSEC) 20 MG capsule   Oral   Take 1 capsule (20 mg total) by mouth daily.   30 capsule   0   . ondansetron (ZOFRAN-ODT) 4 MG disintegrating tablet   Oral   Take 1 tablet (4 mg total) by mouth every 8 (eight) hours as needed for nausea.   10 tablet   0   . Vitamin D, Ergocalciferol, (DRISDOL) 50000 UNITS CAPS   Oral   Take 50,000 Units by mouth 2 (two) times a week. Takes various times in the week.         . loperamide (IMODIUM) 2 MG capsule   Oral   Take 1 capsule (2 mg total) by mouth 4 (four) times daily as needed for diarrhea or loose stools.   12 capsule   0     BP 147/88  Pulse 59  Temp(Src) 98.3 F (36.8 C) (Oral)  Resp 16  SpO2 100%  LMP 03/08/2013  Physical Exam  Constitutional: She is oriented to person, place, and time. She appears well-developed and well-nourished. No distress.  HENT:  Head: Normocephalic and atraumatic.  Mouth/Throat: Oropharynx is clear and moist. No oropharyngeal exudate.  Eyes: Conjunctivae and EOM are normal. Pupils are equal, round, and reactive to light. No scleral icterus.  Neck: Normal range of motion. Neck supple. No tracheal deviation present. No thyromegaly present.  Cardiovascular: Normal rate, regular rhythm, normal heart sounds and intact distal pulses.   Pulmonary/Chest: Effort normal and breath sounds normal. No stridor. No respiratory distress. She has no wheezes.  Abdominal: Soft. There is no tenderness.  Musculoskeletal: Normal range of motion. She exhibits no edema  and no tenderness.  Neurological: She is alert and oriented to person, place, and time. Coordination normal.  Skin: Skin is warm and dry. No rash noted. She is not diaphoretic. No erythema. No pallor.  Psychiatric: She has a normal mood and affect. Her behavior is normal.    ED Course  Procedures (including critical care time)  Labs Reviewed  COMPREHENSIVE METABOLIC PANEL - Abnormal; Notable for the following:    Sodium 132 (*)    Chloride 93 (*)    AST 46 (*)    Total Bilirubin 1.3 (*)    All other components within normal limits  URINALYSIS, ROUTINE W REFLEX MICROSCOPIC - Abnormal; Notable for the following:    APPearance CLOUDY (*)    Bilirubin Urine SMALL (*)    Ketones, ur >80 (*)    Urobilinogen, UA 2.0 (*)    All other components within normal limits  LIPASE, BLOOD  PREGNANCY, URINE  URINE RAPID DRUG SCREEN (HOSP PERFORMED)   No results found.   No diagnosis found.    MDM  Patient is a 27 year old female who presented to emergency department with hyperemesis refractory to antibiotics.  Labs ordered.  Imaging not indicated as patient has no abdominal tenderness and had a normal CT abdomen performed 5 days ago.  Will repeat lipase as was elevated on last emergency department visit.  Anticipate admission once labs result discussed with attending who agrees with plan for far.  Pt to be admitted for dehydration and hyper emesis. The patient appears reasonably stabilized for admission considering the current resources, flow, and capabilities available in the ED at this time, and I doubt any other [facility] requiring further screening and/or treatment in the ED prior to admission.         Jaci Carrel, [place] 04/01/13 1351

## 2013-04-01 NOTE — ED Notes (Signed)
Patient was seen here earlier in the week for pancreatitis.  Patient presents with emesis and no improvement in symptoms.

## 2013-04-02 DIAGNOSIS — K5289 Other specified noninfective gastroenteritis and colitis: Secondary | ICD-10-CM

## 2013-04-02 DIAGNOSIS — K529 Noninfective gastroenteritis and colitis, unspecified: Secondary | ICD-10-CM | POA: Diagnosis present

## 2013-04-02 DIAGNOSIS — F17200 Nicotine dependence, unspecified, uncomplicated: Secondary | ICD-10-CM

## 2013-04-02 LAB — COMPREHENSIVE METABOLIC PANEL
ALT: 19 U/L (ref 0–35)
Albumin: 3.6 g/dL (ref 3.5–5.2)
Alkaline Phosphatase: 49 U/L (ref 39–117)
BUN: 5 mg/dL — ABNORMAL LOW (ref 6–23)
Chloride: 98 mEq/L (ref 96–112)
Glucose, Bld: 86 mg/dL (ref 70–99)
Potassium: 3.8 mEq/L (ref 3.5–5.1)
Sodium: 133 mEq/L — ABNORMAL LOW (ref 135–145)
Total Bilirubin: 1.3 mg/dL — ABNORMAL HIGH (ref 0.3–1.2)

## 2013-04-02 LAB — CBC
MCH: 29.4 pg (ref 26.0–34.0)
MCV: 83.5 fL (ref 78.0–100.0)
Platelets: 242 10*3/uL (ref 150–400)
RDW: 11.5 % (ref 11.5–15.5)

## 2013-04-02 NOTE — Care Management Note (Signed)
CARE MANAGEMENT NOTE 04/02/2013  Patient:  Crystal Wolfe, Crystal Wolfe   Account Number:  [id number]  Date Initiated:  04/02/2013  Documentation initiated by:  Holiday Mcmenamin  Subjective/Objective Assessment:   27 yo female admitted with ABd, N/V. PTA pt independent.     Action/Plan:   Home when stable   Anticipated DC Date:  04/02/2013   Anticipated DC Plan:  HOME/SELF CARE      DC Planning Services  CM consult      Choice offered to / List presented to:  NA   DME arranged  NA      DME agency  NA     HH arranged  NA      HH agency  NA   Status of service:  Completed, signed off Medicare Important Message given?   (If response is "NO", the following Medicare IM given date fields will be blank) Date Medicare IM given:   Date Additional Medicare IM given:    Discharge Disposition:    Per UR Regulation:  Reviewed for med. necessity/level of care/duration of stay  If discussed at Long Length of Stay Meetings, dates discussed:    Comments:  04/02/13 1344 Shenica Holzheimer,RN,BSN [phone] No needs assessed at this time. Pt ambulatory, able to tolerate diet prior to discharge.

## 2013-04-02 NOTE — Progress Notes (Signed)
Patient discharged in stable condition. Discharge instructions given. Pt verbalized understanding.

## 2013-04-02 NOTE — Discharge Summary (Signed)
Physician Discharge Summary  Crystal Wolfe WUJ:[id number] DOB: September 23, 1986 DOA: 04/01/2013  PCP: [name]., MD  Admit date: 04/01/2013 Discharge date: 04/02/2013  Time spent: 25 minutes  Recommendations for Outpatient Follow-up:  1. Patient will follow up with her PCP in the next 4-6 weeks as needed   Discharge Condition: Improved, being discharged home  Diet recommendation: Regular  Filed Weights   04/01/13 1438  Weight: 65.091 kg (143 lb 8 oz)    History of present illness:  27 y/o female who started having nausea, vomiting and diarrhea which started on Monday after she had filet o fish sandwich from [employer]. She did drink one glass of vodka on Sunday. She was seen at the Urgent care and later was seen at the ED and was found to have mildly elevated lipase of 106, which went down to 36 today. She says that the diarrhea has resolved but she continues to have nausea,vomiting.   Hospital Course and Discharge Diagnoses:  Active Problems:   Nausea & vomiting  gastroenteritis: Principal problem. Patient given IV fluids plus medicine when necessary for nausea. By hospital day 2, she was feeling much better, diet able to be advanced and she was discharged home  Tobacco abuse: Patient was counseled  Anxiety disorder: Continued on when necessary Ativan.   Procedures:  None  Consultations:  None  Discharge Exam: Filed Vitals:   04/01/13 1330 04/01/13 1438 04/01/13 2204 04/02/13 0500  BP: 155/84 146/89 138/81 141/84  Pulse: 56 65 70 55  Temp:  99 F (37.2 C) 98 F (36.7 C) 98.4 F (36.9 C)  TempSrc:  Oral Oral Oral  Resp:  18 16 18   Height:  5\' 6"  (1.676 m)    Weight:  65.091 kg (143 lb 8 oz)    SpO2: 100% 100% 100% 100%    General: Alert and oriented x3, fatigue Cardiovascular: Regular rate and rhythm, S1-S2 Respiratory: Clear to auscultation bilaterally  Discharge Instructions  Discharge Orders   Future Orders Complete By Expires     Diet general  As  directed     Increase activity slowly  As directed         Medication List    TAKE these medications       loperamide 2 MG capsule  Commonly known as:  IMODIUM  Take 1 capsule (2 mg total) by mouth 4 (four) times daily as needed for diarrhea or loose stools.     LORazepam 1 MG tablet  Commonly known as:  ATIVAN  Take 1 tablet (1 mg total) by mouth every 6 (six) hours as needed for anxiety.     omeprazole 20 MG capsule  Commonly known as:  PRILOSEC  Take 1 capsule (20 mg total) by mouth daily.     ondansetron 4 MG disintegrating tablet  Commonly known as:  ZOFRAN-ODT  Take 1 tablet (4 mg total) by mouth every 8 (eight) hours as needed for nausea.     Vitamin D (Ergocalciferol) 50000 UNITS Caps  Commonly known as:  DRISDOL  Take 50,000 Units by mouth 2 (two) times a week. Takes various times in the week.           Follow-up Information   Follow up with [name]., MD In 1 month. (As needed)    Contact information:   [address] [place] [address] [phone]        The results of significant diagnostics from this hospitalization (including imaging, microbiology, ancillary and laboratory) are listed below  for reference.    Significant Diagnostic Studies: Ct Abdomen Pelvis W Contrast  03/29/2013   IMPRESSION:  1.  No acute or focal abnormality to explain the patient's symptoms. 2.  No significant stool burden. 3.  Edematous changes of the uterus are most likely related to the patient's menstrual cycle.   Original Report Authenticated By: Marin Roberts, M.D.    Dg Abd Acute W/chest  03/29/2013 IMPRESSION: 1.  Nonspecific, nonobstructive bowel gas pattern, as above. 2.  No pneumoperitoneum. 3.  No radiographic evidence of acute cardiopulmonary disease.   Original Report Authenticated By: Trudie Reed, M.D.      Labs: Basic Metabolic Panel:  Recent Labs Lab 03/29/13 0850 04/01/13 1230 04/02/13 0405  NA 137 132* 133*  K 3.6  3.6 3.8  CL 96 93* 98  CO2 28 27 25   GLUCOSE 91 81 86  BUN 16 11 5*  CREATININE 0.73 0.71 0.70  CALCIUM 10.3 9.2 8.4   Liver Function Tests:  Recent Labs Lab 03/29/13 0850 04/01/13 1230 04/02/13 0405  AST 33 46* 26  ALT 13 26 19   ALKPHOS 67 61 49  BILITOT 0.9 1.3* 1.3*  PROT 8.6* 7.7 6.3  ALBUMIN 4.8 4.5 3.6    Recent Labs Lab 03/29/13 0850 04/01/13 1230  LIPASE 106* 36   CBC:  Recent Labs Lab 03/29/13 0850 04/01/13 1230 04/02/13 0405  WBC 8.9 5.6 5.5  NEUTROABS 5.5  --   --   HGB 14.3 14.1 12.3  HCT 41.4 39.9 35.0*  MCV 85.5 83.1 83.5  PLT 314 289 242     Signed:  Shanaye Rief K  [facility] 04/02/2013, 11:54 AM

## 2013-06-28 ENCOUNTER — Encounter (HOSPITAL_COMMUNITY): Payer: Self-pay | Admitting: *Deleted

## 2013-06-28 ENCOUNTER — Emergency Department (HOSPITAL_COMMUNITY)
Admission: EM | Admit: 2013-06-28 | Discharge: 2013-06-28 | Disposition: A | Discharge status: Home / Self Care | Payer: Self-pay | Attending: Emergency Medicine | Admitting: Emergency Medicine

## 2013-06-28 DIAGNOSIS — F17200 Nicotine dependence, unspecified, uncomplicated: Secondary | ICD-10-CM | POA: Insufficient documentation

## 2013-06-28 DIAGNOSIS — R6883 Chills (without fever): Secondary | ICD-10-CM | POA: Insufficient documentation

## 2013-06-28 DIAGNOSIS — Z8719 Personal history of other diseases of the digestive system: Secondary | ICD-10-CM | POA: Insufficient documentation

## 2013-06-28 DIAGNOSIS — Z79899 Other long term (current) drug therapy: Secondary | ICD-10-CM | POA: Insufficient documentation

## 2013-06-28 DIAGNOSIS — Z872 Personal history of diseases of the skin and subcutaneous tissue: Secondary | ICD-10-CM | POA: Insufficient documentation

## 2013-06-28 DIAGNOSIS — Z880 Allergy status to penicillin: Secondary | ICD-10-CM | POA: Insufficient documentation

## 2013-06-28 DIAGNOSIS — R5383 Other fatigue: Secondary | ICD-10-CM | POA: Insufficient documentation

## 2013-06-28 DIAGNOSIS — Z3202 Encounter for pregnancy test, result negative: Secondary | ICD-10-CM | POA: Insufficient documentation

## 2013-06-28 DIAGNOSIS — R112 Nausea with vomiting, unspecified: Secondary | ICD-10-CM | POA: Insufficient documentation

## 2013-06-28 DIAGNOSIS — R5381 Other malaise: Secondary | ICD-10-CM | POA: Insufficient documentation

## 2013-06-28 LAB — POCT PREGNANCY, URINE: Preg Test, Ur: NEGATIVE

## 2013-06-28 MED ORDER — SODIUM CHLORIDE 0.9 % IV BOLUS (SEPSIS)
1000.0000 mL | Freq: Once | INTRAVENOUS | Status: AC
  Administered 2013-06-28: 1000 mL via INTRAVENOUS

## 2013-06-28 MED ORDER — PROMETHAZINE HCL 25 MG/ML IJ SOLN
25.0000 mg | Freq: Once | INTRAMUSCULAR | Status: AC
  Administered 2013-06-28: 25 mg via INTRAVENOUS
  Filled 2013-06-28: qty 1

## 2013-06-28 MED ORDER — ONDANSETRON HCL 4 MG/2ML IJ SOLN
4.0000 mg | Freq: Once | INTRAMUSCULAR | Status: AC
  Administered 2013-06-28: 4 mg via INTRAVENOUS
  Filled 2013-06-28: qty 2

## 2013-06-28 MED ORDER — ONDANSETRON HCL 4 MG PO TABS: 4.0000 mg | ORAL_TABLET | Freq: Four times a day (QID) | ORAL | Status: DC

## 2013-06-28 NOTE — ED Notes (Signed)
Vomiting since 4pm; abd pain when vomiting

## 2013-06-28 NOTE — Progress Notes (Signed)
[facility] CL handed patient's Mother information for the patient about the [facility] and a list of primary care resources.

## 2013-06-28 NOTE — ED Provider Notes (Signed)
History    CSN: [id number] Arrival date & time 06/28/13  0543  First MD Initiated Contact with Patient 06/28/13 [phone]     Chief Complaint  Patient presents with  . Emesis   (Consider location/radiation/quality/duration/timing/severity/associated sxs/prior Treatment) HPI Comments: 27 year old female with a PMHx of ventral hernia repair June 2013 presents to the emergency department complaining of nausea and vomiting beginning shortly after eating fish and salad yesterday afternoon around 4:30 PM. Patient states she had her family route for dinner, she is the only one who ate fish and got sick. She has vomited multiple times since, last time being in the emergency department. She has been unable to keep anything down. Admits to associated chills without fever and beginning to feel weak. Denies abdominal pain, diarrhea, urinary symptoms. She has been unable to try any alleviating factors as she cannot keep anything down.  Patient is a 27 y.o. female presenting with vomiting. The history is provided by the patient.  Emesis Associated symptoms: chills   Associated symptoms: no abdominal pain and no diarrhea    Past Medical History  Diagnosis Date  . Ventral hernia   . Pilonidal sinus with abscess April2013    drained [id number]   Past Surgical History  Procedure Laterality Date  . Incise and drain abcess  03/24/12    pilonidal abscess  . Ventral hernia repair  05/2012    lap supraumb VWH repair  . Hernia repair  June 2013    laparscopic   Family History  Problem Relation Age of Onset  . Hyperlipidemia Father   . Cancer Maternal Aunt     breast  . Hypertension Maternal Grandmother   . Hypertension Paternal Grandmother   . Diabetes Paternal Grandmother   . Thyroid disease Mother    History  Substance Use Topics  . Smoking status: Current Every Day Smoker -- 0.20 packs/day    Types: Cigarettes  . Smokeless tobacco: Never Used  . Alcohol Use: 0.6 oz/week    1 Glasses of wine  per week   OB History   Grav Para Term Preterm Abortions TAB SAB Ect Mult Living                 Review of Systems  Constitutional: Positive for chills. Negative for fever.  Gastrointestinal: Positive for nausea and vomiting. Negative for abdominal pain and diarrhea.  Neurological: Positive for weakness.  All other systems reviewed and are negative.    Allergies  Codeine and Penicillins  Home Medications   Current Outpatient Rx  Name  Route  Sig  Dispense  Refill  . loperamide (IMODIUM) 2 MG capsule   Oral   Take 1 capsule (2 mg total) by mouth 4 (four) times daily as needed for diarrhea or loose stools.   12 capsule   0   . LORazepam (ATIVAN) 1 MG tablet   Oral   Take 1 tablet (1 mg total) by mouth every 6 (six) hours as needed for anxiety.   10 tablet   0   . omeprazole (PRILOSEC) 20 MG capsule   Oral   Take 1 capsule (20 mg total) by mouth daily.   30 capsule   0   . ondansetron (ZOFRAN-ODT) 4 MG disintegrating tablet   Oral   Take 1 tablet (4 mg total) by mouth every 8 (eight) hours as needed for nausea.   10 tablet   0   . Vitamin D, Ergocalciferol, (DRISDOL) 50000 UNITS CAPS   Oral   Take  50,000 Units by mouth 2 (two) times a week. Takes various times in the week.          BP 124/72  Pulse 99  Temp(Src) 98.9 F (37.2 C) (Oral)  Resp 19  Ht 5\' 6"  (1.676 m)  Wt 140 lb (63.504 kg)  BMI 22.61 kg/m2  SpO2 100%  LMP 05/29/2013 Physical Exam  Nursing note and vitals reviewed. Constitutional: She is oriented to person, place, and time. She appears well-developed and well-nourished. No distress.  HENT:  Head: Normocephalic and atraumatic.  Mouth/Throat: Oropharynx is clear and moist.  Eyes: Conjunctivae are normal.  Neck: Normal range of motion. Neck supple.  Cardiovascular: Normal rate, regular rhythm and normal heart sounds.   Pulmonary/Chest: Effort normal and breath sounds normal.  Abdominal: Soft. Normal appearance and bowel sounds are  normal. She exhibits no distension and no mass. There is no tenderness. There is no rigidity, no rebound and no guarding.  Musculoskeletal: Normal range of motion. She exhibits no edema.  Neurological: She is alert and oriented to person, place, and time.  Skin: Skin is warm and dry. She is not diaphoretic.  Psychiatric: She has a normal mood and affect. Her behavior is normal.    ED Course  Procedures (including critical care time) Labs Reviewed - No data to display No results found. 1. Nausea and vomiting     MDM  Patient with nausea and vomiting after eating fish and salad. No associated abdominal pain. Hx of ventral hernia repair. Normal bowel movements. Abdomen soft, NT, ND. N/v most likely from fish/salad yesterday. She is in NAD. Afebrile. Will give fluids, zofran and re-evaluate.  07:30 AM Still some nausea after initial dose of zofran. Will give another dose of 4mg  and re-assess.  8:06 AM No improvement after second dose of zofran. Will give phenergan.  8:44 AM Patient reports improvement with phenergan. No longer nauseated. Will give PO challenge.  9:46 AM Patient tolerated PO challenge. She is stable for discharge. Rx zofran. Clear liquid diet. Return precautions discussed. She will f/u with PCP. Patient states understanding of plan and is agreeable.   Trevor Mace, PA-C 06/28/13 [phone]

## 2013-06-29 NOTE — ED Provider Notes (Signed)
Medical screening examination/treatment/procedure(s) were performed by non-physician practitioner and as supervising physician I was immediately available for consultation/collaboration.  Sunnie Nielsen, MD 06/29/13 [phone]

## 2013-10-11 ENCOUNTER — Other Ambulatory Visit: Payer: Self-pay

## 2013-10-13 IMAGING — CR DG ABDOMEN ACUTE W/ 1V CHEST
3 series · 3 of 3 positions shown · non-contrast
Comparison: 10/12/2008.

CLINICAL DATA: Abdominal pain.  Nausea.

ACUTE ABDOMEN SERIES (ABDOMEN 2 VIEW & CHEST 1 VIEW)

[w chest pa]
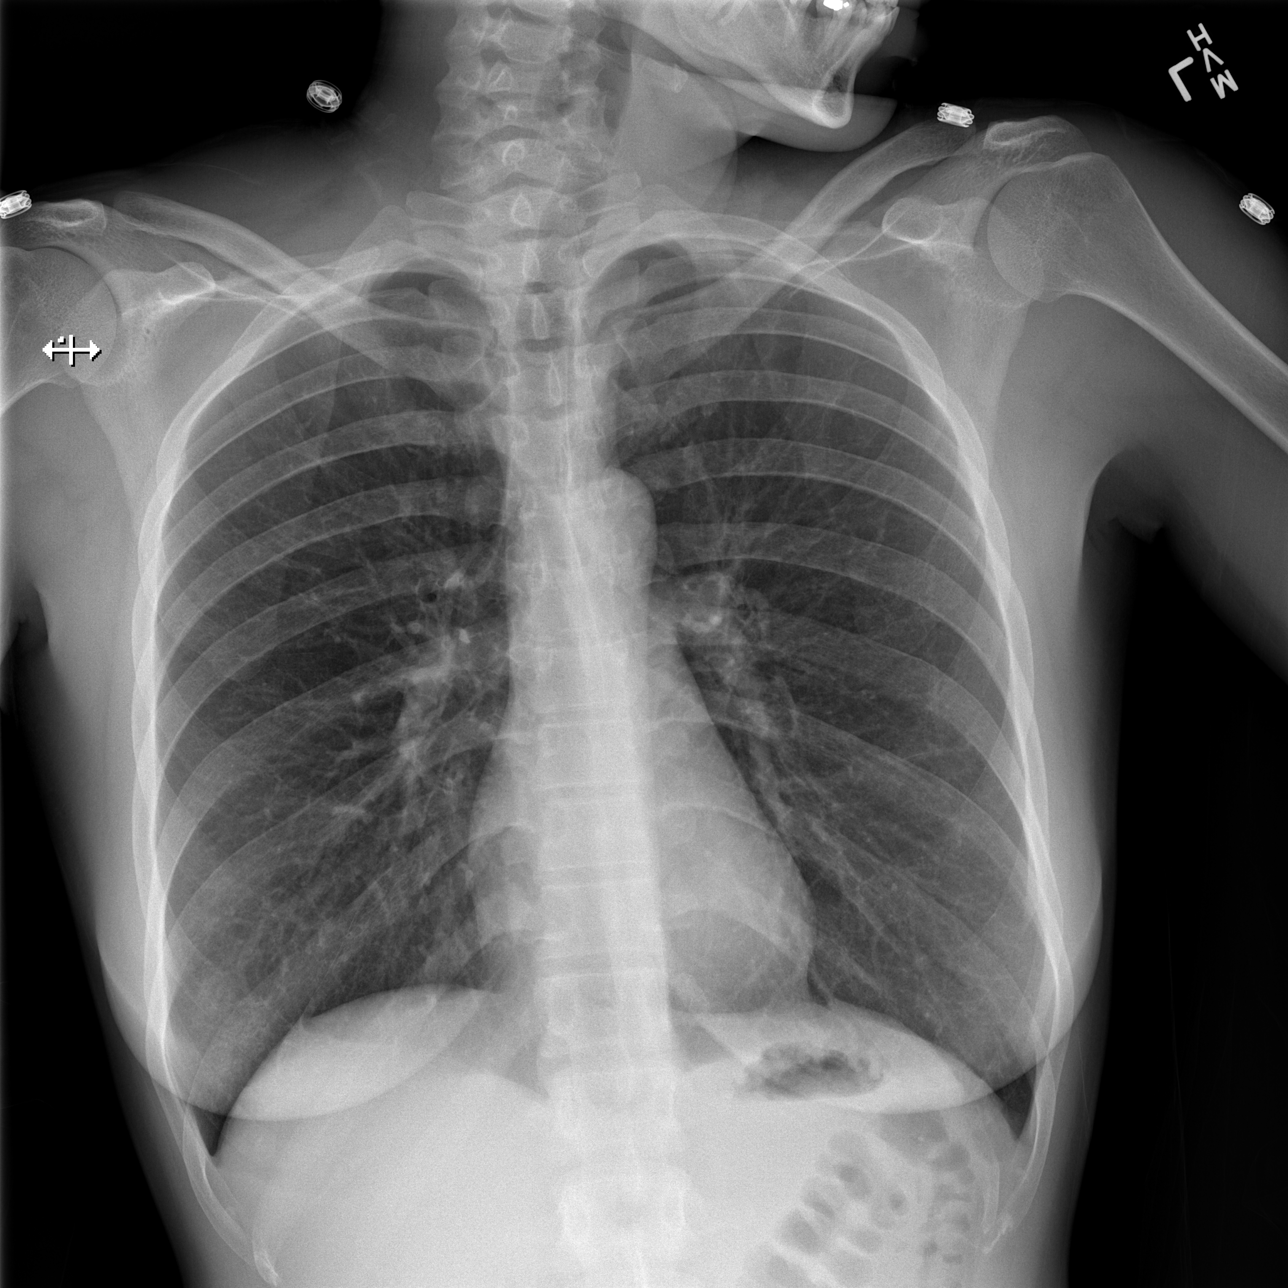

[w abdomen upright]
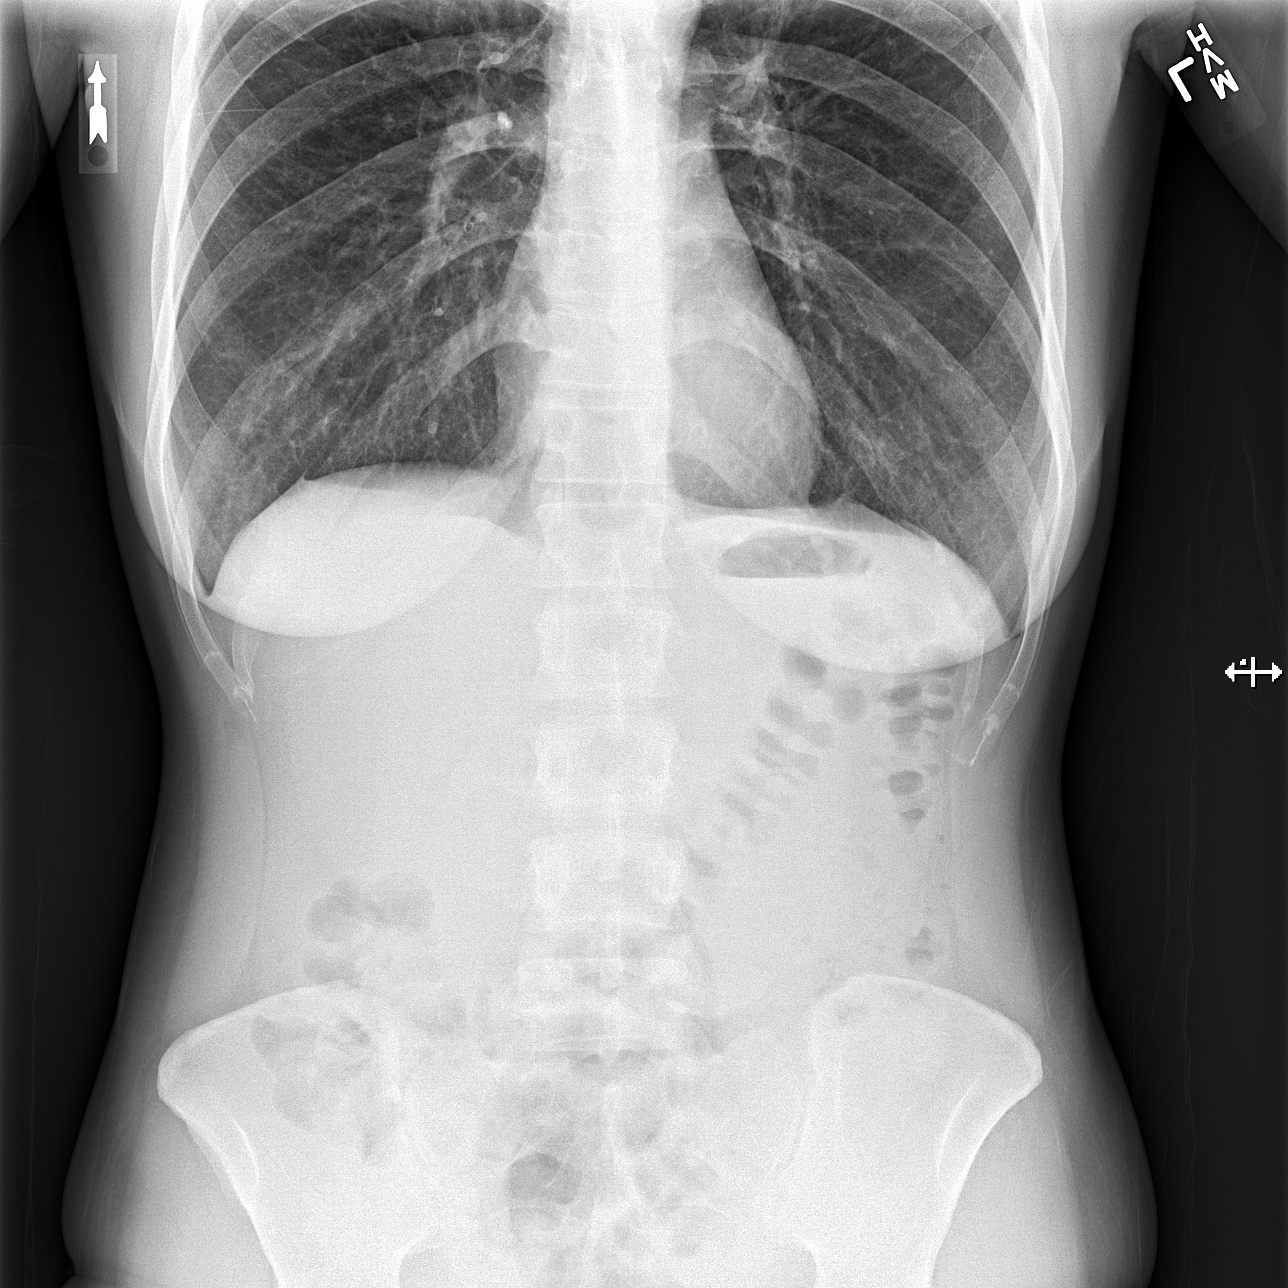

[t abdomen supine]
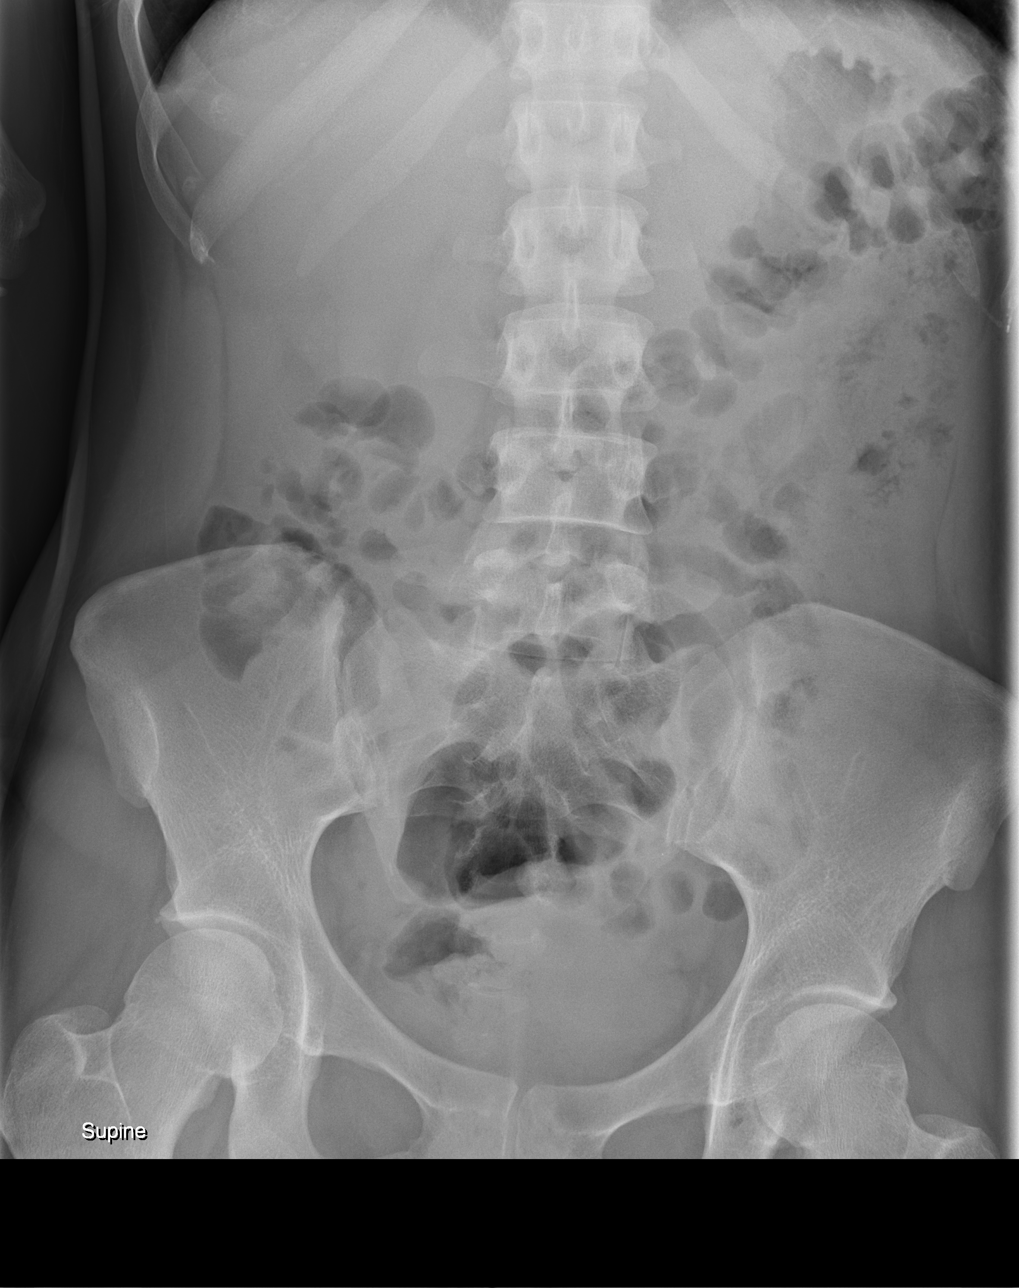

[3 of 3 positions shown; findings below may reference images not displayed]

FINDINGS: Lung volumes are normal.  No consolidative airspace
disease.  No pleural effusions.  No pneumothorax.  No pulmonary
nodule or mass noted.  Pulmonary vasculature and the
cardiomediastinal silhouette are within normal limits.

Gas and stool are seen scattered throughout the colon extending to
the level of the distal rectum.  No pathologic distension of small
bowel.  Some nondilated gas-filled loops of small bowel are noted
in the central abdomen.  No gross evidence of pneumoperitoneum.
Previously noted IUD has been removed.
IMPRESSION: 1.  Nonspecific, nonobstructive bowel gas pattern, as above.
2.  No pneumoperitoneum.
3.  No radiographic evidence of acute cardiopulmonary disease.

## 2013-10-13 IMAGING — CT CT ABD-PELV W/ CM
1 of 2 series · 15 of 32 positions shown, 19 images · IV contrast (OMNIPAQUE 300)
Comparison: Acute abdominal series 03/29/2013.

CLINICAL DATA: Nausea and vomiting.  Constipation.  Status post
ventral hernia repair.

CT ABDOMEN AND PELVIS WITH CONTRAST
TECHNIQUE: Multidetector CT imaging of the abdomen and pelvis was
performed following the standard protocol during bolus
administration of intravenous contrast.
Contrast: 50mL OMNIPAQUE IOHEXOL 300 MG/ML  SOLN, 100mL OMNIPAQUE
IOHEXOL 300 MG/ML  SOLN

[Series 2: abd/pel with · axial · 0.71mm/px · z∈[+1172,+1552]mm · 15 of 84 slices shown, 19 images]
[im 4/84  soft-tissue]
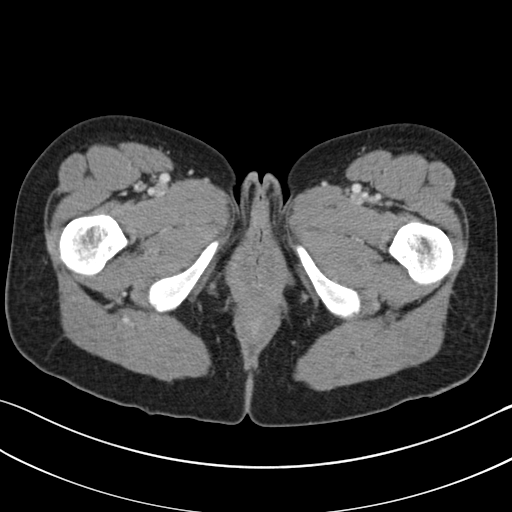
[im 4/84  bone]
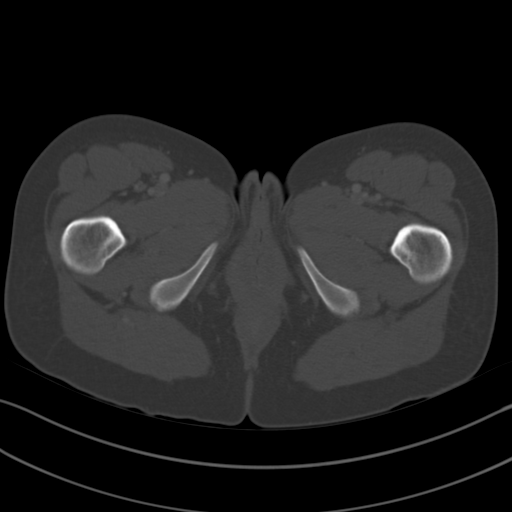
[im 10/84  soft-tissue]
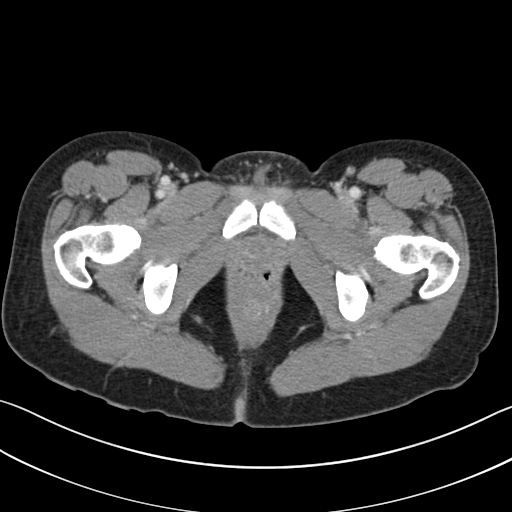
[im 16/84  soft-tissue]
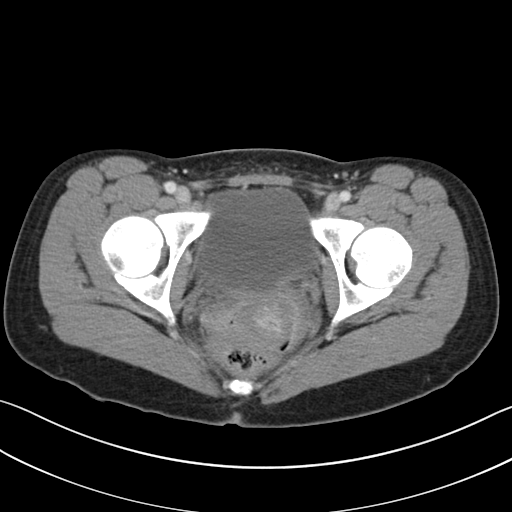
[im 23/84  soft-tissue]
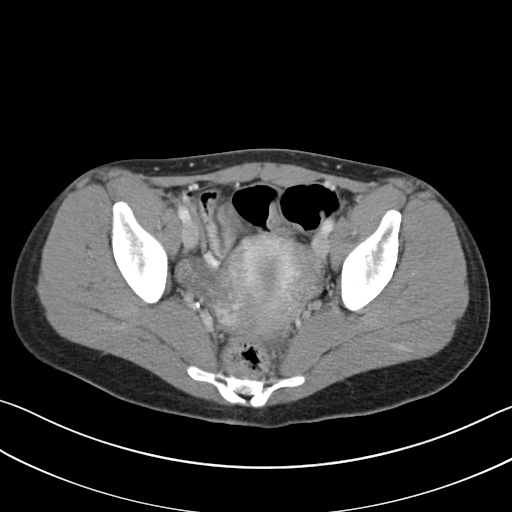
[im 29/84  soft-tissue]
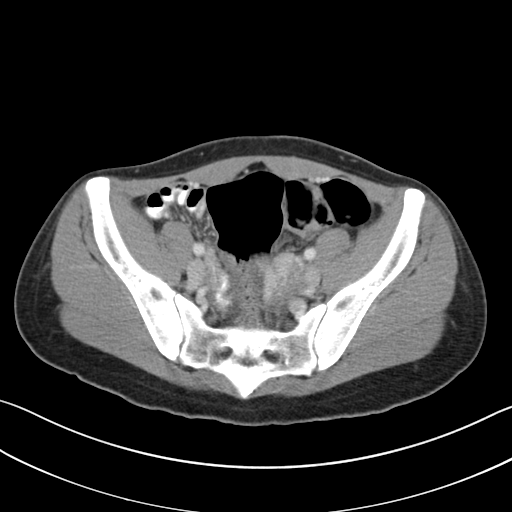
[im 36/84  soft-tissue]
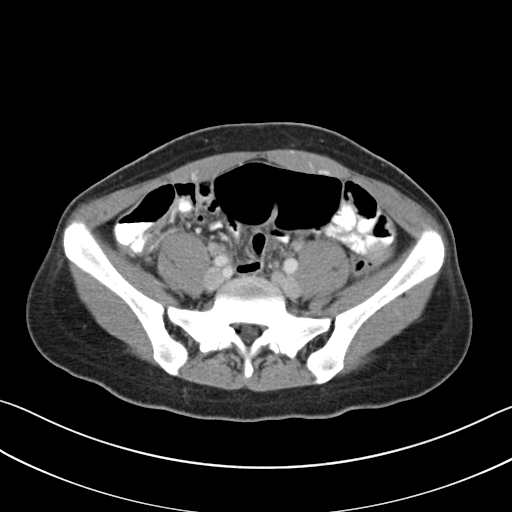
[im 42/84  soft-tissue]
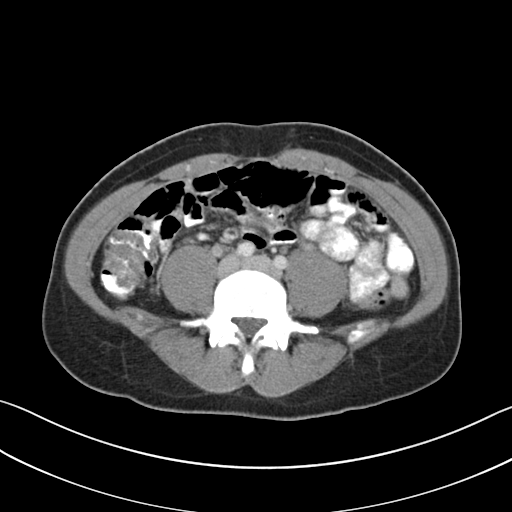
[im 48/84  soft-tissue]
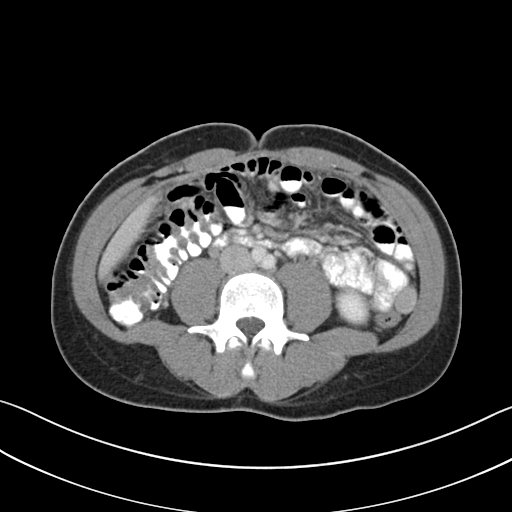
[im 55/84  soft-tissue]
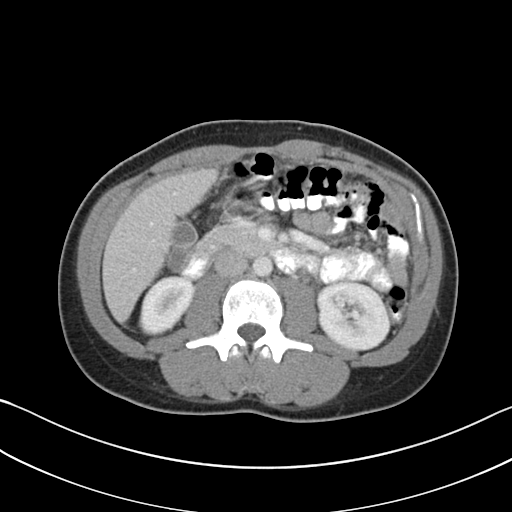
[im 55/84  bone]
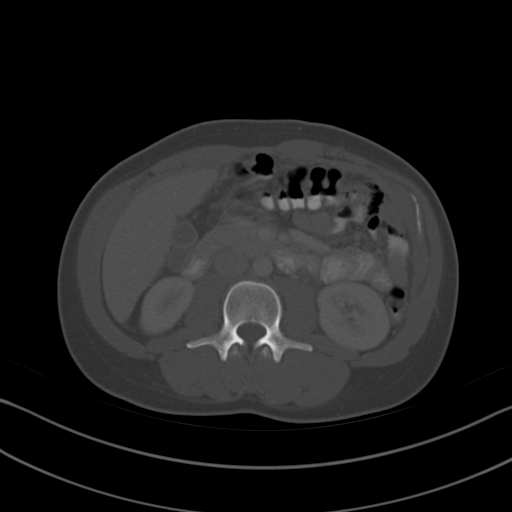
[im 61/84  soft-tissue]
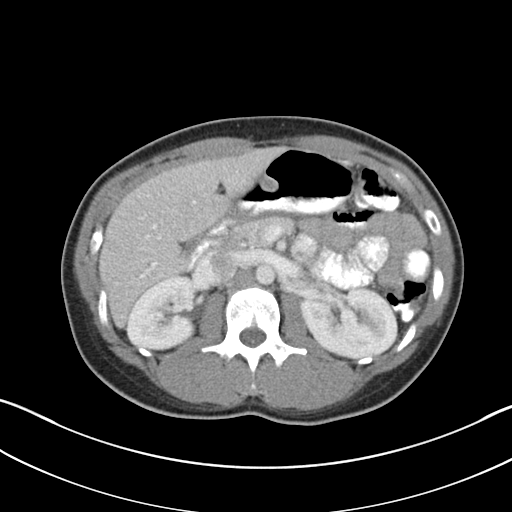
[im 68/84  soft-tissue]
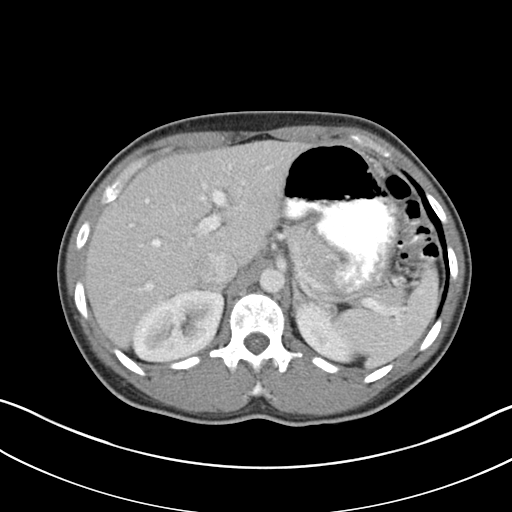
[im 71/84  lung]
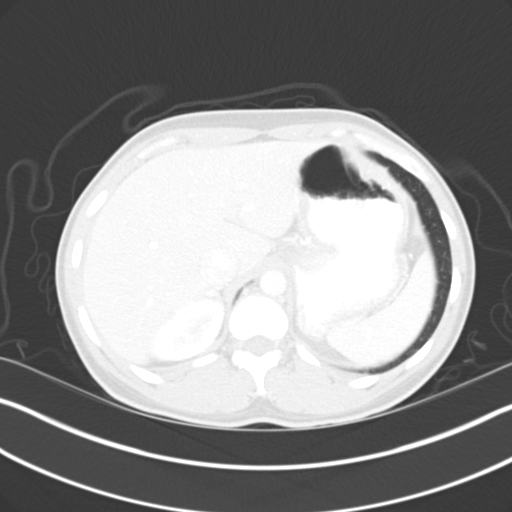
[im 74/84  soft-tissue]
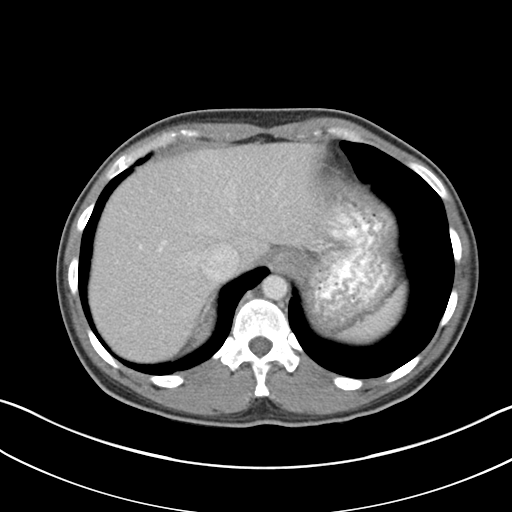
[im 74/84  lung]
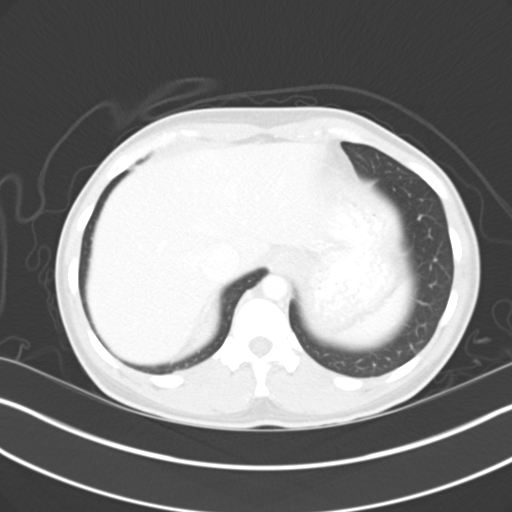
[im 77/84  lung]
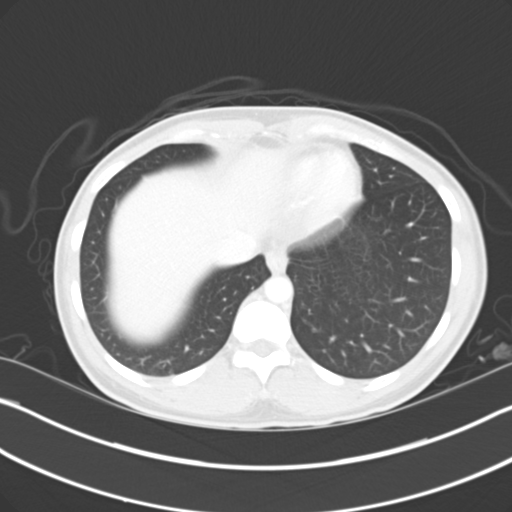
[im 80/84  soft-tissue]
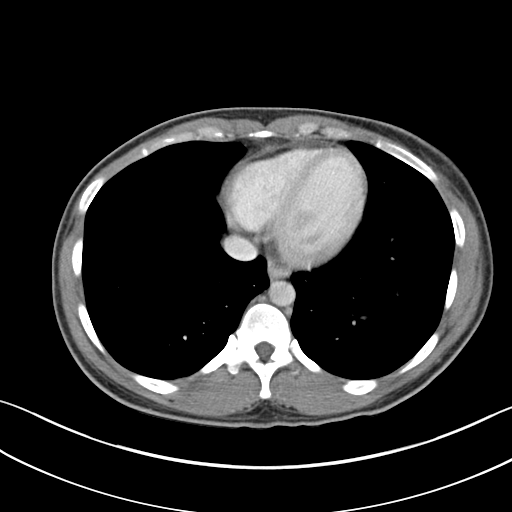
[im 80/84  lung]
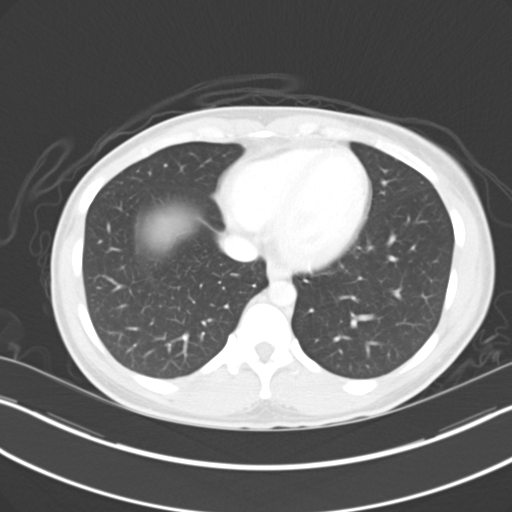

[15 of 32 positions shown; findings below may reference images not displayed]

FINDINGS: The lung bases are clear without focal nodule, mass, or
airspace disease.  Heart size is normal.  No significant pleural or
pericardial effusion is present.

The liver and spleen are within normal limits.  The stomach and
duodenum are unremarkable.  Pancreas is within normal limits.  The
common bile duct and gallbladder are normal.  Adrenal glands are
normal bilaterally.  The kidneys and ureters are within normal
limits bilaterally.  Urinary bladder is unremarkable.

Rectosigmoid colon is within normal limits.  There is no
significant stool burden.  Contrast material can be seen into the
proximal descending colon.  The appendix is visualized and within
normal limits.  The small bowel is unremarkable.  No significant
adenopathy or free fluid is present.  Edematous changes of the
uterus are likely related to the phase of the menstrual cycle.  The
adnexa are within normal limits bilaterally.

The bone windows are unremarkable.
IMPRESSION: 1.  No acute or focal abnormality to explain the patient's
symptoms.
2.  No significant stool burden.
3.  Edematous changes of the uterus are most likely related to the
patient's menstrual cycle.

## 2014-09-20 ENCOUNTER — Other Ambulatory Visit: Payer: Self-pay

## 2015-01-31 ENCOUNTER — Encounter (HOSPITAL_COMMUNITY): Payer: Self-pay

## 2015-01-31 ENCOUNTER — Emergency Department (HOSPITAL_COMMUNITY)
Admission: EM | Admit: 2015-01-31 | Discharge: 2015-01-31 | Disposition: A | Discharge status: Home / Self Care | Payer: 59 | Attending: Emergency Medicine | Admitting: Emergency Medicine

## 2015-01-31 DIAGNOSIS — Z872 Personal history of diseases of the skin and subcutaneous tissue: Secondary | ICD-10-CM | POA: Insufficient documentation

## 2015-01-31 DIAGNOSIS — R112 Nausea with vomiting, unspecified: Secondary | ICD-10-CM | POA: Diagnosis not present

## 2015-01-31 DIAGNOSIS — Z720 Tobacco use: Secondary | ICD-10-CM | POA: Insufficient documentation

## 2015-01-31 DIAGNOSIS — Z8719 Personal history of other diseases of the digestive system: Secondary | ICD-10-CM | POA: Insufficient documentation

## 2015-01-31 DIAGNOSIS — R6883 Chills (without fever): Secondary | ICD-10-CM | POA: Diagnosis present

## 2015-01-31 DIAGNOSIS — Z880 Allergy status to penicillin: Secondary | ICD-10-CM | POA: Diagnosis not present

## 2015-01-31 DIAGNOSIS — R109 Unspecified abdominal pain: Secondary | ICD-10-CM | POA: Insufficient documentation

## 2015-01-31 LAB — URINALYSIS, ROUTINE W REFLEX MICROSCOPIC
Bilirubin Urine: NEGATIVE
Glucose, UA: NEGATIVE mg/dL
Hgb urine dipstick: NEGATIVE
Ketones, ur: 40 mg/dL — AB
Leukocytes, UA: NEGATIVE
Nitrite: NEGATIVE
Protein, ur: 30 mg/dL — AB
Specific Gravity, Urine: 1.024 (ref 1.005–1.030)
Urobilinogen, UA: 0.2 mg/dL (ref 0.0–1.0)
pH: 8.5 — ABNORMAL HIGH (ref 5.0–8.0)

## 2015-01-31 LAB — CBC WITH DIFFERENTIAL/PLATELET
Basophils Absolute: 0 10*3/uL (ref 0.0–0.1)
Basophils Relative: 0 % (ref 0–1)
Eosinophils Absolute: 0 10*3/uL (ref 0.0–0.7)
Eosinophils Relative: 0 % (ref 0–5)
HCT: 38 % (ref 36.0–46.0)
Hemoglobin: 12.8 g/dL (ref 12.0–15.0)
Lymphocytes Relative: 5 % — ABNORMAL LOW (ref 12–46)
Lymphs Abs: 0.6 10*3/uL — ABNORMAL LOW (ref 0.7–4.0)
MCH: 29.6 pg (ref 26.0–34.0)
MCHC: 33.7 g/dL (ref 30.0–36.0)
MCV: 88 fL (ref 78.0–100.0)
Monocytes Absolute: 0.3 10*3/uL (ref 0.1–1.0)
Monocytes Relative: 2 % — ABNORMAL LOW (ref 3–12)
Neutro Abs: 11.9 10*3/uL — ABNORMAL HIGH (ref 1.7–7.7)
Neutrophils Relative %: 93 % — ABNORMAL HIGH (ref 43–77)
Platelets: 294 10*3/uL (ref 150–400)
RBC: 4.32 MIL/uL (ref 3.87–5.11)
RDW: 12.4 % (ref 11.5–15.5)
WBC: 12.8 10*3/uL — ABNORMAL HIGH (ref 4.0–10.5)

## 2015-01-31 LAB — URINE MICROSCOPIC-ADD ON

## 2015-01-31 LAB — BASIC METABOLIC PANEL
Anion gap: 9 (ref 5–15)
BUN: 14 mg/dL (ref 6–23)
CO2: 25 mmol/L (ref 19–32)
Calcium: 9.5 mg/dL (ref 8.4–10.5)
Chloride: 106 mmol/L (ref 96–112)
Creatinine, Ser: 0.72 mg/dL (ref 0.50–1.10)
GFR calc Af Amer: >90 mL/min (ref >90)
GFR calc non Af Amer: >90 mL/min (ref >90)
Glucose, Bld: 125 mg/dL — ABNORMAL HIGH (ref 70–99)
Potassium: 4.2 mmol/L (ref 3.5–5.1)
Sodium: 140 mmol/L (ref 135–145)

## 2015-01-31 LAB — LIPASE, BLOOD: Lipase: 16 U/L (ref 11–59)

## 2015-01-31 LAB — I-STAT BETA HCG BLOOD, ED (MC, WL, AP ONLY): I-stat hCG, quantitative: <5 m[IU]/mL (ref <5)

## 2015-01-31 MED ORDER — SODIUM CHLORIDE 0.9 % IV BOLUS (SEPSIS)
500.0000 mL | Freq: Once | INTRAVENOUS | Status: AC
  Administered 2015-01-31: 500 mL via INTRAVENOUS

## 2015-01-31 MED ORDER — ONDANSETRON HCL 4 MG/2ML IJ SOLN
4.0000 mg | Freq: Once | INTRAMUSCULAR | Status: AC
  Administered 2015-01-31: 4 mg via INTRAVENOUS
  Filled 2015-01-31: qty 2

## 2015-01-31 MED ORDER — ONDANSETRON HCL 4 MG PO TABS: 4.0000 mg | ORAL_TABLET | Freq: Four times a day (QID) | ORAL | Status: DC

## 2015-01-31 MED ORDER — ONDANSETRON 4 MG PREPACK (~~LOC~~): 1.0000 | ORAL_TABLET | Freq: Three times a day (TID) | ORAL | Status: DC | PRN

## 2015-01-31 NOTE — Discharge Instructions (Signed)

## 2015-01-31 NOTE — ED Notes (Addendum)
Pt presents with c/o vomiting and chills that started around 8 am this morning. Pt reports she has thrown up 15+ times today. Pt reports diarrhea and body chills as well. Pt actively vomiting during triage.

## 2015-01-31 NOTE — ED Notes (Signed)
Alerted PA to pt fluid challenge result and that all results are back

## 2015-01-31 NOTE — ED Provider Notes (Signed)
CSN: [id number]     Arrival date & time 01/31/15  1713 History   First MD Initiated Contact with Patient 01/31/15 1718     Chief Complaint  Patient presents with  . Emesis  . Chills     HPI Comments: 29 YO F presents with 10 hours of nausea and vomiting. She states that when she woke up this morning she felt slightly nauseous and attempted to drink some water. She was unable to hold the water down and has vomited with subsequent attempts. She describes the vomit as yellow with no signs of blood. She reports some mild abdominal discomfort with vomiting. Pt denies change in normal dietary habits and does not have any close contacts displaying similar symptoms. She denies changes in urinary habits including frequency, pain, discharge; does note slightly darker urine today. No note of upper respiratory symptoms including, rhinorrhea, coryza, sore throat, or cough. No changes in bowel habits, denies diarrhea or bloody stools. No fevers, rashes, wounds, or other infections. Pt denies recent sexual activity since last menstrual period.   Patient is a 29 y.o. female presenting with vomiting.  Emesis   Past Medical History  Diagnosis Date  . Ventral hernia   . Pilonidal sinus with abscess April2013    drained [id number]   Past Surgical History  Procedure Laterality Date  . Incise and drain abcess  03/24/12    pilonidal abscess  . Ventral hernia repair  05/2012    lap supraumb VWH repair  . Hernia repair  June 2013    laparscopic   Family History  Problem Relation Age of Onset  . Hyperlipidemia Father   . Cancer Maternal Aunt     breast  . Hypertension Maternal Grandmother   . Hypertension Paternal Grandmother   . Diabetes Paternal Grandmother   . Thyroid disease Mother    History  Substance Use Topics  . Smoking status: Current Every Day Smoker -- 0.20 packs/day    Types: Cigarettes  . Smokeless tobacco: Never Used  . Alcohol Use: 0.6 oz/week    1 Glasses of wine per week   OB  History    No data available     Review of Systems  Gastrointestinal: Positive for vomiting.  All other systems reviewed and are negative.     Allergies  Codeine and Penicillins  Home Medications   Prior to Admission medications   Medication Sig Start Date End Date Taking? Authorizing Provider  ondansetron (ZOFRAN) 4 MG tablet Take 1 tablet (4 mg total) by mouth every 6 (six) hours. 06/28/13   Robyn M Hess, PA-C   BP 143/85 mmHg  Pulse 69  Temp(Src) 98.1 F (36.7 C) (Oral)  Resp 18  SpO2 100%  LMP 01/24/2015 (Approximate) Physical Exam  Constitutional: She is oriented to person, place, and time. She appears well-developed and well-nourished.  HENT:  Head: Normocephalic and atraumatic.  Eyes: Pupils are equal, round, and reactive to light.  Neck: Normal range of motion. Neck supple. No JVD present. No tracheal deviation present. No thyromegaly present.  Cardiovascular: Normal rate, regular rhythm, normal heart sounds and intact distal pulses.  Exam reveals no gallop and no friction rub.   No murmur heard. Pulmonary/Chest: Effort normal and breath sounds normal. No stridor. No respiratory distress. She has no wheezes. She has no rales. She exhibits no tenderness.  Abdominal: Soft. Bowel sounds are normal. She exhibits no distension and no mass. There is tenderness. There is no rebound and no guarding.  Musculoskeletal: Normal range  of motion.  Lymphadenopathy:    She has no cervical adenopathy.  Neurological: She is alert and oriented to person, place, and time. Coordination normal.  Skin: Skin is warm and dry.  Psychiatric: She has a normal mood and affect. Her behavior is normal. Judgment and thought content normal.  Nursing note and vitals reviewed.   ED Course  Procedures (including critical care time) Labs Review Labs Reviewed  CBC WITH DIFFERENTIAL/PLATELET  BASIC METABOLIC PANEL  LIPASE, BLOOD  URINALYSIS, ROUTINE W REFLEX MICROSCOPIC  I-STAT BETA HCG BLOOD,  ED ([facility], [facility], [facility] ONLY)    Imaging Review No results found.   EKG Interpretation None      MDM   Final diagnoses:  None   History suggestive of gastritis. No signs of systemic infection, negative urinalysis, normal CBC, BMP, and Lipase. Pt responded well to 4 mg Zofran and IV NaCl. Pt was discharged home with her mother with PO Zofran and given follow-up instructions if symptoms persist or worsen. She is advised to follow-up with her PCP within the next week.       Kelle DartingJeffrey Todd Ipek Westra, PA-C 02/01/15 0143  Candyce ChurnJohn David Wofford III, MD 02/02/15 [phone]

## 2015-03-29 ENCOUNTER — Encounter (HOSPITAL_COMMUNITY): Payer: Self-pay | Admitting: Emergency Medicine

## 2015-03-29 ENCOUNTER — Emergency Department (HOSPITAL_COMMUNITY)
Admission: EM | Admit: 2015-03-29 | Discharge: 2015-03-29 | Disposition: A | Discharge status: Home / Self Care | Payer: 59 | Attending: Emergency Medicine | Admitting: Emergency Medicine

## 2015-03-29 DIAGNOSIS — Z880 Allergy status to penicillin: Secondary | ICD-10-CM | POA: Insufficient documentation

## 2015-03-29 DIAGNOSIS — H6121 Impacted cerumen, right ear: Secondary | ICD-10-CM

## 2015-03-29 DIAGNOSIS — Z8719 Personal history of other diseases of the digestive system: Secondary | ICD-10-CM | POA: Diagnosis not present

## 2015-03-29 DIAGNOSIS — Z872 Personal history of diseases of the skin and subcutaneous tissue: Secondary | ICD-10-CM | POA: Insufficient documentation

## 2015-03-29 DIAGNOSIS — Z720 Tobacco use: Secondary | ICD-10-CM | POA: Diagnosis not present

## 2015-03-29 DIAGNOSIS — H938X1 Other specified disorders of right ear: Secondary | ICD-10-CM | POA: Diagnosis present

## 2015-03-29 NOTE — Discharge Instructions (Signed)
It was our pleasure to provide your ER care today - we hope that you feel better.  Avoid sticking anything into ears, including q tips.   May try over the counter products for ear wax/ear wax removal.  Follow up with primary care doctor in coming week if symptoms fail to improve/resolve.  Return to ER if worse, new symptoms, fevers, hearing loss, severe ear pain, other concern.      Cerumen Impaction A cerumen impaction is when the wax in your ear forms a plug. This plug usually causes reduced hearing. Sometimes it also causes an earache or dizziness. Removing a cerumen impaction can be difficult and painful. The wax sticks to the ear canal. The canal is sensitive and bleeds easily. If you try to remove a heavy wax buildup with a cotton tipped swab, you may push it in further. Irrigation with water, suction, and small ear curettes may be used to clear out the wax. If the impaction is fixed to the skin in the ear canal, ear drops may be needed for a few days to loosen the wax. People who build up a lot of wax frequently can use ear wax removal products available in your local drugstore. SEEK MEDICAL CARE IF:  You develop an earache, increased hearing loss, or marked dizziness. Document Released: 12/30/2004 Document Revised: 02/14/2012 Document Reviewed: 02/19/2010 [facility] Patient Information 2015 [place], [place]. This information is not intended to replace advice given to you by your health care provider. Make sure you discuss any questions you have with your health care provider.

## 2015-03-29 NOTE — ED Notes (Signed)
Pt c/o ear fullness w/o pain to rt side since Tuesday.

## 2015-03-29 NOTE — ED Notes (Signed)
Right ear irrigated with warm water and H2O2.  Ear wax removed.

## 2015-03-29 NOTE — ED Provider Notes (Signed)
CSN: [id number]     Arrival date & time 03/29/15  [id number] History   First MD Initiated Contact with Patient 03/29/15 [phone]     Chief Complaint  Patient presents with  . Ear Fullness     (Consider location/radiation/quality/duration/timing/severity/associated sxs/prior Treatment) Patient is a 29 y.o. female presenting with plugged ear sensation. The history is provided by the patient.  Ear Fullness Pertinent negatives include no headaches.  pt c/o right ear fullness for the past couple days, similar to when had wax build up previously.  Symptoms persistent, constant. Moderate. No hearing loss. No ear trauma or pain. No sore throat, congestion or uri c/o. No fever or chills. No headaches.      Past Medical History  Diagnosis Date  . Ventral hernia   . Pilonidal sinus with abscess April2013    drained [id number]   Past Surgical History  Procedure Laterality Date  . Incise and drain abcess  03/24/12    pilonidal abscess  . Ventral hernia repair  05/2012    lap supraumb VWH repair  . Hernia repair  June 2013    laparscopic   Family History  Problem Relation Age of Onset  . Hyperlipidemia Father   . Cancer Maternal Aunt     breast  . Hypertension Maternal Grandmother   . Hypertension Paternal Grandmother   . Diabetes Paternal Grandmother   . Thyroid disease Mother    History  Substance Use Topics  . Smoking status: Current Every Day Smoker -- 0.20 packs/day    Types: Cigarettes  . Smokeless tobacco: Never Used  . Alcohol Use: 0.6 oz/week    1 Glasses of wine per week   OB History    No data available     Review of Systems  Constitutional: Negative for fever and chills.  HENT: Negative for hearing loss and sore throat.   Gastrointestinal: Negative for vomiting.  Neurological: Negative for headaches.      Allergies  Codeine and Penicillins  Home Medications   Prior to Admission medications   Medication Sig Start Date End Date Taking? Authorizing Provider   acetaminophen (TYLENOL) 500 MG tablet Take 1,000 mg by mouth every 6 (six) hours as needed for headache (headache).    Historical Provider, MD  ondansetron (ZOFRAN) 4 MG tablet Take 1 tablet (4 mg total) by mouth every 6 (six) hours. 01/31/15   Tinnie GensJeffrey Hedges, PA-C   BP 126/65 mmHg  Pulse 69  Temp(Src) 97.8 F (36.6 C) (Oral)  Resp 18  SpO2 100% Physical Exam  Constitutional: She appears well-developed and well-nourished. No distress.  HENT:  Mouth/Throat: Oropharynx is clear and moist.  Right cerumen impaction.  Ear canal not grossly swollen or erythematous.     Eyes: Conjunctivae are normal. No scleral icterus.  Neck: Neck supple. No tracheal deviation present.  Cardiovascular: Normal rate.   Pulmonary/Chest: Effort normal. No respiratory distress.  Abdominal: Normal appearance.  Musculoskeletal: She exhibits no edema.  Lymphadenopathy:    She has no cervical adenopathy.  Neurological: She is alert.  Skin: Skin is warm and dry. No rash noted. She is not diaphoretic.  Psychiatric: She has a normal mood and affect.  Nursing note and vitals reviewed.   ED Course  Procedures (including critical care time)     MDM   Reviewed nursing notes and prior charts for additional history.   Nursing to irrigate ear/cerumen impaction.    Cathren LaineKevin Janel Beane, MD 03/30/15 [phone]

## 2015-12-25 ENCOUNTER — Other Ambulatory Visit (HOSPITAL_COMMUNITY)
Admission: RE | Admit: 2015-12-25 | Discharge: 2015-12-25 | Disposition: A | Discharge status: Home / Self Care | Payer: 59 | Source: Ambulatory Visit | Attending: Obstetrics & Gynecology | Admitting: Obstetrics & Gynecology

## 2015-12-25 ENCOUNTER — Other Ambulatory Visit: Payer: Self-pay | Admitting: Obstetrics & Gynecology

## 2015-12-25 DIAGNOSIS — Z1151 Encounter for screening for human papillomavirus (HPV): Secondary | ICD-10-CM | POA: Diagnosis not present

## 2015-12-25 DIAGNOSIS — Z01419 Encounter for gynecological examination (general) (routine) without abnormal findings: Secondary | ICD-10-CM | POA: Insufficient documentation

## 2015-12-30 LAB — CYTOLOGY - PAP

## 2016-01-04 ENCOUNTER — Emergency Department (HOSPITAL_COMMUNITY)
Admission: EM | Admit: 2016-01-04 | Discharge: 2016-01-04 | Disposition: A | Discharge status: Home / Self Care | Payer: 59 | Attending: Emergency Medicine | Admitting: Emergency Medicine

## 2016-01-04 ENCOUNTER — Encounter (HOSPITAL_COMMUNITY): Payer: Self-pay | Admitting: *Deleted

## 2016-01-04 DIAGNOSIS — Z880 Allergy status to penicillin: Secondary | ICD-10-CM | POA: Diagnosis not present

## 2016-01-04 DIAGNOSIS — K529 Noninfective gastroenteritis and colitis, unspecified: Secondary | ICD-10-CM | POA: Insufficient documentation

## 2016-01-04 DIAGNOSIS — F17210 Nicotine dependence, cigarettes, uncomplicated: Secondary | ICD-10-CM | POA: Diagnosis not present

## 2016-01-04 DIAGNOSIS — Z872 Personal history of diseases of the skin and subcutaneous tissue: Secondary | ICD-10-CM | POA: Diagnosis not present

## 2016-01-04 DIAGNOSIS — R1110 Vomiting, unspecified: Secondary | ICD-10-CM | POA: Diagnosis present

## 2016-01-04 LAB — URINE MICROSCOPIC-ADD ON

## 2016-01-04 LAB — COMPREHENSIVE METABOLIC PANEL
ALBUMIN: 5 g/dL (ref 3.5–5.0)
ALK PHOS: 61 U/L (ref 38–126)
ALT: 18 U/L (ref 14–54)
AST: 24 U/L (ref 15–41)
Anion gap: 16 — ABNORMAL HIGH (ref 5–15)
BUN: 13 mg/dL (ref 6–20)
CO2: 21 mmol/L — AB (ref 22–32)
CREATININE: 0.65 mg/dL (ref 0.44–1.00)
Calcium: 9.9 mg/dL (ref 8.9–10.3)
Chloride: 103 mmol/L (ref 101–111)
GFR calc non Af Amer: >60 mL/min (ref >60)
GLUCOSE: 129 mg/dL — AB (ref 65–99)
Potassium: 4.1 mmol/L (ref 3.5–5.1)
SODIUM: 140 mmol/L (ref 135–145)
Total Bilirubin: 0.8 mg/dL (ref 0.3–1.2)
Total Protein: 8.4 g/dL — ABNORMAL HIGH (ref 6.5–8.1)

## 2016-01-04 LAB — URINALYSIS, ROUTINE W REFLEX MICROSCOPIC
BILIRUBIN URINE: NEGATIVE
GLUCOSE, UA: NEGATIVE mg/dL
Hgb urine dipstick: NEGATIVE
Ketones, ur: 40 mg/dL — AB
Leukocytes, UA: NEGATIVE
Nitrite: NEGATIVE
Protein, ur: 30 mg/dL — AB
Specific Gravity, Urine: 1.03 (ref 1.005–1.030)
pH: 7 (ref 5.0–8.0)

## 2016-01-04 LAB — CBC
HCT: 39.9 % (ref 36.0–46.0)
Hemoglobin: 13.1 g/dL (ref 12.0–15.0)
MCH: 29.4 pg (ref 26.0–34.0)
MCHC: 32.8 g/dL (ref 30.0–36.0)
MCV: 89.5 fL (ref 78.0–100.0)
PLATELETS: 277 10*3/uL (ref 150–400)
RBC: 4.46 MIL/uL (ref 3.87–5.11)
RDW: 12.3 % (ref 11.5–15.5)
WBC: 8.9 10*3/uL (ref 4.0–10.5)

## 2016-01-04 LAB — LIPASE, BLOOD: Lipase: 21 U/L (ref 11–51)

## 2016-01-04 MED ORDER — SODIUM CHLORIDE 0.9 % IV SOLN
1000.0000 mL | INTRAVENOUS | Status: DC
  Administered 2016-01-04: 1000 mL via INTRAVENOUS

## 2016-01-04 MED ORDER — ONDANSETRON 4 MG PO TBDP: 4.0000 mg | ORAL_TABLET | ORAL | Status: DC | PRN

## 2016-01-04 MED ORDER — ONDANSETRON HCL 4 MG/2ML IJ SOLN
4.0000 mg | Freq: Once | INTRAMUSCULAR | Status: AC
  Administered 2016-01-04: 4 mg via INTRAVENOUS
  Filled 2016-01-04: qty 2

## 2016-01-04 MED ORDER — LOPERAMIDE HCL 2 MG PO CAPS: ORAL_CAPSULE | ORAL | Status: DC

## 2016-01-04 MED ORDER — SODIUM CHLORIDE 0.9 % IV SOLN
1000.0000 mL | Freq: Once | INTRAVENOUS | Status: AC
  Administered 2016-01-04: 1000 mL via INTRAVENOUS

## 2016-01-04 NOTE — Discharge Instructions (Signed)

## 2016-01-04 NOTE — ED Notes (Addendum)
Patient states she has been vomiting hourly since @ 2030 last night.  Patient denies blood in emesis.  Patient also endorses 2 episodes of diarrhea with onset of emesis.  Patient endorses cold chills.  Patient denies sick contacts.  Patient states she is unable to tolerate oral intake.  Patient has hx of gastroenteritis that is often prompted by spicy foods, but patient denies eating anything spicy in last couple of days.  Patient had hernia repair in 2014.  Patient denies abdominal pain.  Patient is dry heaving in triage.

## 2016-01-04 NOTE — ED Provider Notes (Signed)
CSN: [id number]     Arrival date & time 01/04/16  1235 History   First MD Initiated Contact with Patient 01/04/16 1401     Chief Complaint  Patient presents with  . Emesis     (Consider location/radiation/quality/duration/timing/severity/associated sxs/prior Treatment) HPI Patient started having vomiting yesterday evening. She reports she's had multiple episodes overnight. She also reports having diarrhea in association with emesis. She has had some cold chills and sweats. She reports she has had some cramping abdominal discomfort. She reports that she ate with multiple family members yesterday but none of them have become sick. She thinks it may have started a little earlier because Friday at work she was just feeling a little off but had not developed any real symptoms. She does not believe it to be food poisoning. No syncope or lightheadedness. She reports she just feels fatigued now. Past Medical History  Diagnosis Date  . Ventral hernia   . Pilonidal sinus with abscess April2013    drained [id number]   Past Surgical History  Procedure Laterality Date  . Incise and drain abcess  03/24/12    pilonidal abscess  . Ventral hernia repair  05/2012    lap supraumb VWH repair  . Hernia repair  June 2013    laparscopic   Family History  Problem Relation Age of Onset  . Hyperlipidemia Father   . Cancer Maternal Aunt     breast  . Hypertension Maternal Grandmother   . Hypertension Paternal Grandmother   . Diabetes Paternal Grandmother   . Thyroid disease Mother    Social History  Substance Use Topics  . Smoking status: Current Some Day Smoker -- 0.20 packs/day    Types: Cigarettes  . Smokeless tobacco: Never Used  . Alcohol Use: 0.6 oz/week    1 Glasses of wine per week   OB History    No data available     Review of Systems  10 Systems reviewed and are negative for acute change except as noted in the HPI.   Allergies  Codeine and Penicillins  Home Medications   Prior  to Admission medications   Medication Sig Start Date End Date Taking? Authorizing Provider  acetaminophen (TYLENOL) 500 MG tablet Take 1,000 mg by mouth every 6 (six) hours as needed for headache (headache).   Yes Historical Provider, MD  loperamide (IMODIUM) 2 MG capsule Take two tabs po initially, then one tab after each loose stool: max 8 tabs in 24 hours 01/04/16   Arby Barrette, MD  ondansetron (ZOFRAN ODT) 4 MG disintegrating tablet Take 1 tablet (4 mg total) by mouth every 4 (four) hours as needed for nausea or vomiting. 01/04/16   Arby Barrette, MD   BP 102/57 mmHg  Pulse 69  Temp(Src) 98.6 F (37 C) (Oral)  Resp 18  SpO2 100%  LMP 12/11/2015 Physical Exam  Constitutional: She is oriented to person, place, and time. She appears well-developed and well-nourished.  HENT:  Head: Normocephalic and atraumatic.  Eyes: EOM are normal. Pupils are equal, round, and reactive to light.  Neck: Neck supple.  Cardiovascular: Normal rate, regular rhythm, normal heart sounds and intact distal pulses.   Pulmonary/Chest: Effort normal and breath sounds normal.  Abdominal: Soft. Bowel sounds are normal. She exhibits no distension. There is no tenderness.  Musculoskeletal: Normal range of motion. She exhibits no edema.  Neurological: She is alert and oriented to person, place, and time. She has normal strength. Coordination normal. GCS eye subscore is 4. GCS verbal subscore  is 5. GCS motor subscore is 6.  Skin: Skin is warm, dry and intact.  Psychiatric: She has a normal mood and affect.    ED Course  Procedures (including critical care time) Labs Review Labs Reviewed  COMPREHENSIVE METABOLIC PANEL - Abnormal; Notable for the following:    CO2 21 (*)    Glucose, Bld 129 (*)    Total Protein 8.4 (*)    Anion gap 16 (*)    All other components within normal limits  URINALYSIS, ROUTINE W REFLEX MICROSCOPIC (NOT AT [facility]) - Abnormal; Notable for the following:    APPearance CLOUDY (*)     Ketones, ur 40 (*)    Protein, ur 30 (*)    All other components within normal limits  URINE MICROSCOPIC-ADD ON - Abnormal; Notable for the following:    Squamous Epithelial / LPF 0-5 (*)    Bacteria, UA FEW (*)    All other components within normal limits  LIPASE, BLOOD  CBC    Imaging Review No results found. I have personally reviewed and evaluated these images and lab results as part of my medical decision-making.   EKG Interpretation None      MDM   Final diagnoses:  Gastroenteritis   Patient presents with symptoms consistent with viral gastroenteritis. He has multiple episodes of vomiting and diarrhea. Patient is alert and nontoxic. She is hydrated with a liter of fluids. Diagnostic results are stable. At this point I feel she is safe for discharge with Zofran and loperamide. Return precautions are provided.    Arby Barrette, MD 01/04/16 [phone]

## 2016-01-04 NOTE — ED Notes (Signed)
Bed: WA25 Expected date:  Expected time:  Means of arrival:  Comments: Triage 2 

## 2016-04-18 ENCOUNTER — Encounter (HOSPITAL_COMMUNITY): Payer: Self-pay

## 2016-04-18 ENCOUNTER — Emergency Department (HOSPITAL_COMMUNITY)
Admission: EM | Admit: 2016-04-18 | Discharge: 2016-04-18 | Disposition: A | Discharge status: Home / Self Care | Payer: 59 | Attending: Emergency Medicine | Admitting: Emergency Medicine

## 2016-04-18 DIAGNOSIS — Z79899 Other long term (current) drug therapy: Secondary | ICD-10-CM | POA: Diagnosis not present

## 2016-04-18 DIAGNOSIS — Z872 Personal history of diseases of the skin and subcutaneous tissue: Secondary | ICD-10-CM | POA: Insufficient documentation

## 2016-04-18 DIAGNOSIS — Z8719 Personal history of other diseases of the digestive system: Secondary | ICD-10-CM | POA: Insufficient documentation

## 2016-04-18 DIAGNOSIS — R112 Nausea with vomiting, unspecified: Secondary | ICD-10-CM | POA: Diagnosis present

## 2016-04-18 DIAGNOSIS — Z880 Allergy status to penicillin: Secondary | ICD-10-CM | POA: Insufficient documentation

## 2016-04-18 DIAGNOSIS — Z3202 Encounter for pregnancy test, result negative: Secondary | ICD-10-CM | POA: Insufficient documentation

## 2016-04-18 DIAGNOSIS — Z87891 Personal history of nicotine dependence: Secondary | ICD-10-CM | POA: Diagnosis not present

## 2016-04-18 DIAGNOSIS — R1084 Generalized abdominal pain: Secondary | ICD-10-CM | POA: Diagnosis not present

## 2016-04-18 LAB — COMPREHENSIVE METABOLIC PANEL
ALK PHOS: 63 U/L (ref 38–126)
ALT: 14 U/L (ref 14–54)
ANION GAP: 8 (ref 5–15)
AST: 23 U/L (ref 15–41)
Albumin: 4.9 g/dL (ref 3.5–5.0)
BILIRUBIN TOTAL: 1 mg/dL (ref 0.3–1.2)
BUN: 11 mg/dL (ref 6–20)
CO2: 28 mmol/L (ref 22–32)
CREATININE: 0.78 mg/dL (ref 0.44–1.00)
Calcium: 9.6 mg/dL (ref 8.9–10.3)
Chloride: 103 mmol/L (ref 101–111)
GFR calc Af Amer: >60 mL/min (ref >60)
GFR calc non Af Amer: >60 mL/min (ref >60)
GLUCOSE: 118 mg/dL — AB (ref 65–99)
Potassium: 4 mmol/L (ref 3.5–5.1)
Sodium: 139 mmol/L (ref 135–145)
TOTAL PROTEIN: 8.6 g/dL — AB (ref 6.5–8.1)

## 2016-04-18 LAB — URINALYSIS, ROUTINE W REFLEX MICROSCOPIC
Bilirubin Urine: NEGATIVE
GLUCOSE, UA: NEGATIVE mg/dL
Hgb urine dipstick: NEGATIVE
Leukocytes, UA: NEGATIVE
Nitrite: NEGATIVE
PH: 7 (ref 5.0–8.0)
Protein, ur: 30 mg/dL — AB
Specific Gravity, Urine: 1.031 — ABNORMAL HIGH (ref 1.005–1.030)

## 2016-04-18 LAB — CBC
HEMATOCRIT: 39.6 % (ref 36.0–46.0)
Hemoglobin: 13.2 g/dL (ref 12.0–15.0)
MCH: 29.2 pg (ref 26.0–34.0)
MCHC: 33.3 g/dL (ref 30.0–36.0)
MCV: 87.6 fL (ref 78.0–100.0)
PLATELETS: 311 10*3/uL (ref 150–400)
RBC: 4.52 MIL/uL (ref 3.87–5.11)
RDW: 12.4 % (ref 11.5–15.5)
WBC: 10.6 10*3/uL — ABNORMAL HIGH (ref 4.0–10.5)

## 2016-04-18 LAB — URINE MICROSCOPIC-ADD ON

## 2016-04-18 LAB — I-STAT BETA HCG BLOOD, ED (MC, WL, AP ONLY): I-stat hCG, quantitative: <5 m[IU]/mL (ref <5)

## 2016-04-18 LAB — LIPASE, BLOOD: Lipase: 18 U/L (ref 11–51)

## 2016-04-18 MED ORDER — ONDANSETRON 4 MG PO TBDP
4.0000 mg | ORAL_TABLET | Freq: Once | ORAL | Status: AC | PRN
  Administered 2016-04-18: 4 mg via ORAL
  Filled 2016-04-18: qty 1

## 2016-04-18 MED ORDER — METOCLOPRAMIDE HCL 5 MG/ML IJ SOLN
10.0000 mg | INTRAMUSCULAR | Status: AC
  Administered 2016-04-18: 10 mg via INTRAVENOUS
  Filled 2016-04-18: qty 2

## 2016-04-18 MED ORDER — SODIUM CHLORIDE 0.9 % IV BOLUS (SEPSIS)
1000.0000 mL | Freq: Once | INTRAVENOUS | Status: AC
Start: 2016-04-18 — End: 2016-04-18
  Administered 2016-04-18: 1000 mL via INTRAVENOUS

## 2016-04-18 MED ORDER — METOCLOPRAMIDE HCL 10 MG PO TABS: 10.0000 mg | ORAL_TABLET | Freq: Three times a day (TID) | ORAL | Status: DC | PRN

## 2016-04-18 NOTE — ED Notes (Signed)
Pt cannot use restroom at this time, aware urine specimen is needed.  

## 2016-04-18 NOTE — ED Provider Notes (Signed)
CSN: [id number]     Arrival date & time 04/18/16  1504 History   First MD Initiated Contact with Patient 04/18/16 1558     Chief Complaint  Patient presents with  . Abdominal Pain  . Emesis     (Consider location/radiation/quality/duration/timing/severity/associated sxs/prior Treatment) Patient is a 30 y.o. female presenting with abdominal pain and vomiting. The history is provided by the patient and medical records.  Abdominal Pain Associated symptoms: nausea and vomiting   Emesis Associated symptoms: abdominal pain    30 y.o. F here with abdominal pain.  She ate at [employer] last night with her mother for Mother's day.  She ate steak with cheddar butter, truffle fries with olive oil, and dessert.  She states shortly after she ate and began driving home she began to feel very nauseated. She states she pulled around the site of red and began vomiting. She states she has continued vomiting intermittently throughout the night. States she did have one loose stool yesterday, none today. She denies any fever or chills. Does have some generalized, cramping abdominal pain.  She did take Zofran at home without relief. Patient reports since she had her ventral hernia repaired a few years ago she has had a very "sensitive stomach". She states the food was too "rich" for her.  No other abdominal surgeries.    Past Medical History  Diagnosis Date  . Ventral hernia   . Pilonidal sinus with abscess April2013    drained [id number]   Past Surgical History  Procedure Laterality Date  . Incise and drain abcess  03/24/12    pilonidal abscess  . Ventral hernia repair  05/2012    lap supraumb VWH repair  . Hernia repair  June 2013    laparscopic   Family History  Problem Relation Age of Onset  . Hyperlipidemia Father   . Cancer Maternal Aunt     breast  . Hypertension Maternal Grandmother   . Hypertension Paternal Grandmother   . Diabetes Paternal Grandmother   . Thyroid disease Mother     Social History  Substance Use Topics  . Smoking status: Former Smoker -- 0.20 packs/day  . Smokeless tobacco: Never Used  . Alcohol Use: 0.6 oz/week    1 Glasses of wine per week     Comment: rarely   OB History    No data available     Review of Systems  Gastrointestinal: Positive for nausea, vomiting and abdominal pain.  All other systems reviewed and are negative.     Allergies  Codeine and Penicillins  Home Medications   Prior to Admission medications   Medication Sig Start Date End Date Taking? Authorizing Provider  acetaminophen (TYLENOL) 500 MG tablet Take 1,000 mg by mouth every 6 (six) hours as needed for headache (headache).   Yes Historical Provider, MD  ondansetron (ZOFRAN ODT) 4 MG disintegrating tablet Take 1 tablet (4 mg total) by mouth every 4 (four) hours as needed for nausea or vomiting. 01/04/16  Yes Arby Barrette, MD   BP 155/81 mmHg  Pulse 80  Temp(Src) 98.6 F (37 C) (Oral)  Resp 18  Ht  (1.676 m)  Wt 63.504 kg  BMI 22.61 kg/m2  SpO2 100%  LMP 04/05/2016   Physical Exam  Constitutional: She is oriented to person, place, and time. She appears well-developed and well-nourished. No distress.  Lying in bed with eyes closed answering questions, no distress noted  HENT:  Head: Normocephalic and atraumatic.  Mouth/Throat: Oropharynx is clear  and moist.  Mildly dry mucous membranes  Eyes: Conjunctivae and EOM are normal. Pupils are equal, round, and reactive to light.  Neck: Normal range of motion. Neck supple.  Cardiovascular: Normal rate, regular rhythm and normal heart sounds.   Pulmonary/Chest: Effort normal and breath sounds normal. No respiratory distress. She has no wheezes.  Abdominal: Soft. Bowel sounds are normal. There is no tenderness. There is no guarding and no CVA tenderness.  Abdomen soft, nontender, no focal tenderness or peritoneal signs  Musculoskeletal: Normal range of motion. She exhibits no edema.  Neurological: She is  alert and oriented to person, place, and time.  Skin: Skin is warm and dry. She is not diaphoretic.  Psychiatric: She has a normal mood and affect.  Nursing note and vitals reviewed.   ED Course  Procedures (including critical care time) Labs Review Labs Reviewed  COMPREHENSIVE METABOLIC PANEL - Abnormal; Notable for the following:    Glucose, Bld 118 (*)    Total Protein 8.6 (*)    All other components within normal limits  CBC - Abnormal; Notable for the following:    WBC 10.6 (*)    All other components within normal limits  URINALYSIS, ROUTINE W REFLEX MICROSCOPIC (NOT [facility]) - Abnormal; Notable for the following:    APPearance CLOUDY (*)    Specific Gravity, Urine 1.031 (*)    Ketones, ur >80 (*)    Protein, ur 30 (*)    All other components within normal limits  URINE MICROSCOPIC-ADD ON - Abnormal; Notable for the following:    Squamous Epithelial / LPF 0-5 (*)    Bacteria, UA RARE (*)    All other components within normal limits  LIPASE, BLOOD  I-STAT BETA HCG BLOOD, ED ([facility], [facility], [facility] ONLY)    Imaging Review No results found. I have personally reviewed and evaluated these images and lab results as part of my medical decision-making.   EKG Interpretation None      MDM   Final diagnoses:  Nausea and vomiting, vomiting of unspecified type   30 year old female here with abdominal cramping, nausea, and vomiting since last night. She believes it was the dinner she ate at the restaurant as she got sick almost immediately afterwards. Patient is afebrile, nontoxic. Her abdominal exam is benign although she endorses generalized abdominal cramping. Her vital signs are stable. She does have mildly dry mucous membranes. Patient's labs are overall reassuring. She does have ketones present in the urine which is likely secondary to dehydration from her vomiting. Patient was treated with IV fluids and Reglan with resolution of her symptoms. She is tolerating ice chips and ginger ale  without difficulty. Her abdomen remains soft and benign. Patient appears stable for discharge. Rx Reglan. Encouraged gentle diet for the next few days of progress back to normal as tolerated. Follow-up with PCP.  Discussed plan with patient, he/she acknowledged understanding and agreed with plan of care.  Return precautions given for new or worsening symptoms.   Garlon HatchetLisa M Dyllon Henken, PA-C 04/18/16 2111  Tilden FossaElizabeth Rees, MD 04/19/16 [phone]

## 2016-04-18 NOTE — Discharge Instructions (Signed)
Take the prescribed medication as directed for nausea.  Recommend gentle diet for the next day or two, progress back to normal as tolerated. Follow-up with your primary care doctor. Return to the ED for new or worsening symptoms.

## 2016-04-18 NOTE — ED Notes (Addendum)
Patient c/o mid abdominal pain and vomiting since last night. Patient denies any diarrhea. Patient states she last took Zofran at home around 1000.

## 2016-04-20 ENCOUNTER — Encounter (HOSPITAL_COMMUNITY): Payer: Self-pay | Admitting: Emergency Medicine

## 2016-04-20 ENCOUNTER — Emergency Department (HOSPITAL_COMMUNITY)
Admission: EM | Admit: 2016-04-20 | Discharge: 2016-04-20 | Disposition: A | Discharge status: Home / Self Care | Payer: 59 | Attending: Emergency Medicine | Admitting: Emergency Medicine

## 2016-04-20 DIAGNOSIS — Z79899 Other long term (current) drug therapy: Secondary | ICD-10-CM | POA: Insufficient documentation

## 2016-04-20 DIAGNOSIS — R197 Diarrhea, unspecified: Secondary | ICD-10-CM | POA: Diagnosis not present

## 2016-04-20 DIAGNOSIS — R112 Nausea with vomiting, unspecified: Secondary | ICD-10-CM | POA: Diagnosis present

## 2016-04-20 DIAGNOSIS — Z87891 Personal history of nicotine dependence: Secondary | ICD-10-CM | POA: Insufficient documentation

## 2016-04-20 LAB — CBC WITH DIFFERENTIAL/PLATELET
Basophils Absolute: 0 10*3/uL (ref 0.0–0.1)
Basophils Relative: 0 %
EOS PCT: 0 %
Eosinophils Absolute: 0 10*3/uL (ref 0.0–0.7)
HCT: 39.7 % (ref 36.0–46.0)
Hemoglobin: 13.4 g/dL (ref 12.0–15.0)
LYMPHS ABS: 2.3 10*3/uL (ref 0.7–4.0)
LYMPHS PCT: 23 %
MCH: 29.5 pg (ref 26.0–34.0)
MCHC: 33.8 g/dL (ref 30.0–36.0)
MCV: 87.4 fL (ref 78.0–100.0)
Monocytes Absolute: 0.9 10*3/uL (ref 0.1–1.0)
Monocytes Relative: 9 %
Neutro Abs: 6.7 10*3/uL (ref 1.7–7.7)
Neutrophils Relative %: 68 %
PLATELETS: 288 10*3/uL (ref 150–400)
RBC: 4.54 MIL/uL (ref 3.87–5.11)
RDW: 12.2 % (ref 11.5–15.5)
WBC: 9.8 10*3/uL (ref 4.0–10.5)

## 2016-04-20 LAB — COMPREHENSIVE METABOLIC PANEL
ALK PHOS: 53 U/L (ref 38–126)
ALT: 17 U/L (ref 14–54)
AST: 27 U/L (ref 15–41)
Albumin: 4.8 g/dL (ref 3.5–5.0)
Anion gap: 11 (ref 5–15)
BILIRUBIN TOTAL: 1.1 mg/dL (ref 0.3–1.2)
BUN: 16 mg/dL (ref 6–20)
CALCIUM: 9.8 mg/dL (ref 8.9–10.3)
CHLORIDE: 99 mmol/L — AB (ref 101–111)
CO2: 28 mmol/L (ref 22–32)
CREATININE: 0.8 mg/dL (ref 0.44–1.00)
Glucose, Bld: 98 mg/dL (ref 65–99)
Potassium: 3.5 mmol/L (ref 3.5–5.1)
Sodium: 138 mmol/L (ref 135–145)
TOTAL PROTEIN: 7.4 g/dL (ref 6.5–8.1)

## 2016-04-20 LAB — URINALYSIS, ROUTINE W REFLEX MICROSCOPIC
BILIRUBIN URINE: NEGATIVE
GLUCOSE, UA: NEGATIVE mg/dL
HGB URINE DIPSTICK: NEGATIVE
Ketones, ur: >80 mg/dL — AB
Leukocytes, UA: NEGATIVE
Nitrite: NEGATIVE
PROTEIN: NEGATIVE mg/dL
Specific Gravity, Urine: 1.023 (ref 1.005–1.030)
pH: 7 (ref 5.0–8.0)

## 2016-04-20 LAB — PREGNANCY, URINE: Preg Test, Ur: NEGATIVE

## 2016-04-20 LAB — LIPASE, BLOOD: LIPASE: 17 U/L (ref 11–51)

## 2016-04-20 MED ORDER — SODIUM CHLORIDE 0.9 % IV BOLUS (SEPSIS)
1000.0000 mL | Freq: Once | INTRAVENOUS | Status: AC
  Administered 2016-04-20: 1000 mL via INTRAVENOUS

## 2016-04-20 MED ORDER — GI COCKTAIL ~~LOC~~
30.0000 mL | Freq: Once | ORAL | Status: AC
Start: 2016-04-20 — End: 2016-04-20
  Administered 2016-04-20: 30 mL via ORAL

## 2016-04-20 MED ORDER — ONDANSETRON HCL 4 MG/2ML IJ SOLN
4.0000 mg | Freq: Once | INTRAMUSCULAR | Status: AC
  Administered 2016-04-20: 4 mg via INTRAVENOUS
  Filled 2016-04-20: qty 2

## 2016-04-20 MED ORDER — FAMOTIDINE 20 MG PO TABS: 20.0000 mg | ORAL_TABLET | Freq: Two times a day (BID) | ORAL | Status: DC

## 2016-04-20 MED ORDER — FAMOTIDINE IN NACL 20-0.9 MG/50ML-% IV SOLN
20.0000 mg | Freq: Once | INTRAVENOUS | Status: AC
  Administered 2016-04-20: 20 mg via INTRAVENOUS
  Filled 2016-04-20: qty 50

## 2016-04-20 NOTE — ED Provider Notes (Signed)
CSN: [id number]     Arrival date & time 04/20/16  0753 History   First MD Initiated Contact with Patient 04/20/16 0830     Chief Complaint  Patient presents with  . Nausea  . Emesis   HPI Comments: 29 year old female who presents with nausea, vomiting, diarrhea and abdominal pain for the past 3 days. This that has been an ongoing issue for her for several years. She states ever since she had a ventral hernia repair she has had issues with having a sensitive stomach. Fatty foods and spicy foods irritate her. Although her vomiting is triggered by these foods she continues to have symptoms when she is not eating either. She was previously prescribed Zofran and was not having relief from that. When she was here 2 days ago she was prescribed Reglan which has not been providing relief either. She did have a CT scan in 2014 which was negative. Reports associated body chills and diarrhea. Denies fever, chills, chest pain, shortness of breath, blood in her stool or urine, dysuria, vaginal discharge, vaginal bleeding.  Patient is a 30 y.o. female presenting with vomiting.  Emesis Associated symptoms: abdominal pain, chills and diarrhea     Past Medical History  Diagnosis Date  . Ventral hernia   . Pilonidal sinus with abscess April2013    drained [id number]   Past Surgical History  Procedure Laterality Date  . Incise and drain abcess  03/24/12    pilonidal abscess  . Ventral hernia repair  05/2012    lap supraumb VWH repair  . Hernia repair  June 2013    laparscopic   Family History  Problem Relation Age of Onset  . Hyperlipidemia Father   . Cancer Maternal Aunt     breast  . Hypertension Maternal Grandmother   . Hypertension Paternal Grandmother   . Diabetes Paternal Grandmother   . Thyroid disease Mother    Social History  Substance Use Topics  . Smoking status: Former Smoker -- 0.20 packs/day  . Smokeless tobacco: Never Used  . Alcohol Use: 0.6 oz/week    1 Glasses of wine per week      Comment: rarely   OB History    No data available     Review of Systems  Constitutional: Positive for chills. Negative for fever.  Respiratory: Negative for shortness of breath.   Cardiovascular: Negative for chest pain.  Gastrointestinal: Positive for nausea, vomiting, abdominal pain and diarrhea. Negative for constipation.  Genitourinary: Negative for dysuria.  All other systems reviewed and are negative.   Allergies  Codeine and Penicillins  Home Medications   Prior to Admission medications   Medication Sig Start Date End Date Taking? Authorizing Provider  metoCLOPramide (REGLAN) 10 MG tablet Take 1 tablet (10 mg total) by mouth 3 (three) times daily as needed for nausea (headache / nausea). 04/18/16  Yes Garlon Hatchet, PA-C  ondansetron (ZOFRAN ODT) 4 MG disintegrating tablet Take 1 tablet (4 mg total) by mouth every 4 (four) hours as needed for nausea or vomiting. 01/04/16  Yes Arby Barrette, MD   BP 168/85 mmHg  Pulse 63  Temp(Src) 98.7 F (37.1 C) (Oral)  Resp 16  SpO2 99%  LMP 04/05/2016   Physical Exam  Constitutional: She is oriented to person, place, and time. She appears well-developed and well-nourished. No distress.  HENT:  Head: Normocephalic and atraumatic.  Eyes: Conjunctivae are normal. Pupils are equal, round, and reactive to light. Right eye exhibits no discharge. Left eye exhibits no  discharge. No scleral icterus.  Neck: Normal range of motion.  Cardiovascular: Normal rate and regular rhythm.  Exam reveals no gallop and no friction rub.   No murmur heard. Pulmonary/Chest: Effort normal and breath sounds normal. No respiratory distress. She has no wheezes. She has no rales. She exhibits no tenderness.  Abdominal: Soft. Bowel sounds are normal. She exhibits no distension and no mass. There is tenderness. There is no rebound and no guarding.  Epigastric tenderness  Neurological: She is alert and oriented to person, place, and time.  Skin: Skin is  warm and dry. She is not diaphoretic.  Psychiatric: She has a normal mood and affect.    ED Course  Procedures (including critical care time) Labs Review Labs Reviewed  COMPREHENSIVE METABOLIC PANEL - Abnormal; Notable for the following:    Chloride 99 (*)    All other components within normal limits  URINALYSIS, ROUTINE W REFLEX MICROSCOPIC (NOT AT [facility]) - Abnormal; Notable for the following:    APPearance CLOUDY (*)    Ketones, ur >80 (*)    All other components within normal limits  CBC WITH DIFFERENTIAL/PLATELET  LIPASE, BLOOD  PREGNANCY, URINE    Imaging Review No results found. I have personally reviewed and evaluated these images and lab results as part of my medical decision-making.   EKG Interpretation None      MDM   Final diagnoses:  Nausea vomiting and diarrhea   30 year old female presents with nausea, vomiting, diarrhea and abdominal pain for the past several days. Patient is afebrile with stable vital signs. CBC is unremarkable. CMP is unremarkable. Lipase is normal. Pregnancy test is negative. UA is remarkable for >80 ketones. No imaging indicated at this time. She had a scan in 2014 when she was having similar symptoms which was negative. Since this has been an ongoing issue for her and recommend GI follow-up. Patient tolerated PO challenge well. Pepcid, Zofran, and GI cocktail provided significant relief. They've an appointment with PCP this afternoon. They are to have prescriptions for Zofran and Reglan. Patient is NAD, non-toxic, with stable VS. Patient is informed of clinical course, understands medical decision making process, and agrees with plan. Opportunity for questions provided and all questions answered. Return precautions given.    Bethel BornKelly Marie Gekas, PA-C 04/20/16 1142  Laurence Spatesachel Morgan Little, MD 04/21/16 [phone]

## 2016-04-20 NOTE — ED Notes (Signed)
Patient here from home with complaints of nausea vomiting since Saturday. Unable to keep anything down. Seen on 5/14 for same.

## 2016-04-20 NOTE — ED Notes (Signed)
PT have been made aware of urine sample 

## 2016-06-16 ENCOUNTER — Emergency Department (HOSPITAL_COMMUNITY)
Admission: EM | Admit: 2016-06-16 | Discharge: 2016-06-17 | Disposition: A | Discharge status: Home / Self Care | Payer: 59 | Attending: Emergency Medicine | Admitting: Emergency Medicine

## 2016-06-16 ENCOUNTER — Encounter (HOSPITAL_COMMUNITY): Payer: Self-pay | Admitting: Emergency Medicine

## 2016-06-16 DIAGNOSIS — R1110 Vomiting, unspecified: Secondary | ICD-10-CM | POA: Diagnosis not present

## 2016-06-16 DIAGNOSIS — Z5321 Procedure and treatment not carried out due to patient leaving prior to being seen by health care provider: Secondary | ICD-10-CM | POA: Insufficient documentation

## 2016-06-16 MED ORDER — ONDANSETRON 4 MG PO TBDP: 4.0000 mg | ORAL_TABLET | Freq: Once | ORAL | Status: DC | PRN

## 2016-06-16 NOTE — ED Notes (Signed)
Pt c/o emesis x 8 onset 1730 today, pt ate steak and cheese she believes was not good @ 1400. Pt diaphoretic, continuous heaving in triage.

## 2016-06-18 ENCOUNTER — Encounter (HOSPITAL_COMMUNITY): Payer: Self-pay

## 2016-06-18 ENCOUNTER — Emergency Department (HOSPITAL_COMMUNITY)
Admission: EM | Admit: 2016-06-18 | Discharge: 2016-06-18 | Disposition: A | Discharge status: Home / Self Care | Payer: 59 | Attending: Emergency Medicine | Admitting: Emergency Medicine

## 2016-06-18 DIAGNOSIS — F1290 Cannabis use, unspecified, uncomplicated: Secondary | ICD-10-CM | POA: Diagnosis not present

## 2016-06-18 DIAGNOSIS — R112 Nausea with vomiting, unspecified: Secondary | ICD-10-CM | POA: Diagnosis not present

## 2016-06-18 DIAGNOSIS — Z87891 Personal history of nicotine dependence: Secondary | ICD-10-CM | POA: Insufficient documentation

## 2016-06-18 DIAGNOSIS — Z79899 Other long term (current) drug therapy: Secondary | ICD-10-CM | POA: Insufficient documentation

## 2016-06-18 DIAGNOSIS — R109 Unspecified abdominal pain: Secondary | ICD-10-CM | POA: Diagnosis not present

## 2016-06-18 LAB — URINE MICROSCOPIC-ADD ON

## 2016-06-18 LAB — CBC
HEMATOCRIT: 40.1 % (ref 36.0–46.0)
HEMOGLOBIN: 13.5 g/dL (ref 12.0–15.0)
MCH: 29.3 pg (ref 26.0–34.0)
MCHC: 33.7 g/dL (ref 30.0–36.0)
MCV: 87.2 fL (ref 78.0–100.0)
Platelets: 301 10*3/uL (ref 150–400)
RBC: 4.6 MIL/uL (ref 3.87–5.11)
RDW: 12.7 % (ref 11.5–15.5)
WBC: 10 10*3/uL (ref 4.0–10.5)

## 2016-06-18 LAB — URINALYSIS, ROUTINE W REFLEX MICROSCOPIC
Glucose, UA: NEGATIVE mg/dL
Hgb urine dipstick: NEGATIVE
Ketones, ur: >80 mg/dL — AB
Leukocytes, UA: NEGATIVE
NITRITE: NEGATIVE
PH: 6 (ref 5.0–8.0)
Protein, ur: 30 mg/dL — AB
SPECIFIC GRAVITY, URINE: 1.029 (ref 1.005–1.030)

## 2016-06-18 LAB — COMPREHENSIVE METABOLIC PANEL
ALBUMIN: 5.2 g/dL — AB (ref 3.5–5.0)
ALT: 17 U/L (ref 14–54)
ANION GAP: 11 (ref 5–15)
AST: 24 U/L (ref 15–41)
Alkaline Phosphatase: 65 U/L (ref 38–126)
BILIRUBIN TOTAL: 0.8 mg/dL (ref 0.3–1.2)
BUN: 19 mg/dL (ref 6–20)
CO2: 26 mmol/L (ref 22–32)
Calcium: 9.5 mg/dL (ref 8.9–10.3)
Chloride: 101 mmol/L (ref 101–111)
Creatinine, Ser: 0.68 mg/dL (ref 0.44–1.00)
GFR calc Af Amer: >60 mL/min (ref >60)
GFR calc non Af Amer: >60 mL/min (ref >60)
GLUCOSE: 117 mg/dL — AB (ref 65–99)
POTASSIUM: 3.5 mmol/L (ref 3.5–5.1)
SODIUM: 138 mmol/L (ref 135–145)
TOTAL PROTEIN: 8.6 g/dL — AB (ref 6.5–8.1)

## 2016-06-18 LAB — I-STAT BETA HCG BLOOD, ED (MC, WL, AP ONLY): I-stat hCG, quantitative: <5 m[IU]/mL (ref <5)

## 2016-06-18 LAB — LIPASE, BLOOD: Lipase: 23 U/L (ref 11–51)

## 2016-06-18 MED ORDER — SCOPOLAMINE 1 MG/3DAYS TD PT72
1.0000 | MEDICATED_PATCH | TRANSDERMAL | Status: DC
  Administered 2016-06-18: 1.5 mg via TRANSDERMAL
  Filled 2016-06-18: qty 1

## 2016-06-18 MED ORDER — SODIUM CHLORIDE 0.9 % IV BOLUS (SEPSIS)
2000.0000 mL | Freq: Once | INTRAVENOUS | Status: AC
  Administered 2016-06-18: 2000 mL via INTRAVENOUS

## 2016-06-18 MED ORDER — FAMOTIDINE IN NACL 20-0.9 MG/50ML-% IV SOLN
20.0000 mg | Freq: Once | INTRAVENOUS | Status: AC
  Administered 2016-06-18: 20 mg via INTRAVENOUS
  Filled 2016-06-18: qty 50

## 2016-06-18 MED ORDER — ONDANSETRON HCL 4 MG/2ML IJ SOLN
4.0000 mg | Freq: Once | INTRAMUSCULAR | Status: AC
  Administered 2016-06-18: 4 mg via INTRAVENOUS
  Filled 2016-06-18: qty 2

## 2016-06-18 MED ORDER — ONDANSETRON 4 MG PO TBDP: 4.0000 mg | ORAL_TABLET | Freq: Three times a day (TID) | ORAL | Status: DC | PRN

## 2016-06-18 NOTE — ED Notes (Signed)
Pt here Wednesday but left prior to being seen.  Pt with no abdominal pain but states emesis since Wednesday. Sweats but unknown for fever. States no urination since last night.

## 2016-06-18 NOTE — ED Provider Notes (Signed)
CSN: [id number]     Arrival date & time 06/18/16  1105 History   First MD Initiated Contact with Patient 06/18/16 1213     Chief Complaint  Patient presents with  . Emesis  . Dysuria      HPI  She presents for evaluation of vomiting. History of recurrent episodes of vomiting over the last few years. His been told to take Prilosec. Took 1 dose one time and it "didn't help". States she was vomiting yesterday. Presented here on Wednesday, 2 days ago. Child had a long wait in the emergency room and left. Vomiting recurred today and presents here. No abdominal pain. No blood in her stools. No hematemesis. Heme-negative nonbilious emesis. No fevers or chills. No dysuria or urinary symptoms.  Past Medical History  Diagnosis Date  . Ventral hernia   . Pilonidal sinus with abscess April2013    drained [id number]   Past Surgical History  Procedure Laterality Date  . Incise and drain abcess  03/24/12    pilonidal abscess  . Ventral hernia repair  05/2012    lap supraumb VWH repair  . Hernia repair  June 2013    laparscopic   Family History  Problem Relation Age of Onset  . Hyperlipidemia Father   . Cancer Maternal Aunt     breast  . Hypertension Maternal Grandmother   . Hypertension Paternal Grandmother   . Diabetes Paternal Grandmother   . Thyroid disease Mother    Social History  Substance Use Topics  . Smoking status: Former Smoker -- 0.20 packs/day  . Smokeless tobacco: Never Used  . Alcohol Use: 0.6 oz/week    1 Glasses of wine per week     Comment: rarely   OB History    No data available     Review of Systems  Constitutional: Negative for fever, chills, diaphoresis, appetite change and fatigue.  HENT: Negative for mouth sores, sore throat and trouble swallowing.   Eyes: Negative for visual disturbance.  Respiratory: Negative for cough, chest tightness, shortness of breath and wheezing.   Cardiovascular: Negative for chest pain.  Gastrointestinal: Positive for nausea,  vomiting and abdominal pain. Negative for diarrhea and abdominal distention.  Endocrine: Negative for polydipsia, polyphagia and polyuria.  Genitourinary: Negative for dysuria, frequency and hematuria.  Musculoskeletal: Negative for gait problem.  Skin: Negative for color change, pallor and rash.  Neurological: Negative for dizziness, syncope, light-headedness and headaches.  Hematological: Does not bruise/bleed easily.  Psychiatric/Behavioral: Negative for behavioral problems and confusion.      Allergies  Codeine and Penicillins  Home Medications   Prior to Admission medications   Medication Sig Start Date End Date Taking? Authorizing Provider  Cholecalciferol (VITAMIN D) 2000 units CAPS Take 2,000 Units by mouth daily.   Yes Historical Provider, MD  omeprazole (PRILOSEC) 40 MG capsule Take 40 mg by mouth daily as needed (heartburn).  05/19/16  Yes Historical Provider, MD  promethazine (PHENERGAN) 25 MG suppository Place 25 mg rectally every 8 (eight) hours as needed for nausea or vomiting.   Yes Historical Provider, MD  famotidine (PEPCID) 20 MG tablet Take 1 tablet (20 mg total) by mouth 2 (two) times daily. Patient not taking: Reported on 06/16/2016 04/20/16   Bethel Born, PA-C  metoCLOPramide (REGLAN) 10 MG tablet Take 1 tablet (10 mg total) by mouth 3 (three) times daily as needed for nausea (headache / nausea). Patient not taking: Reported on 06/16/2016 04/18/16   Garlon Hatchet, PA-C  ondansetron [facility] ODT) 4  MG disintegrating tablet Take 1 tablet (4 mg total) by mouth every 8 (eight) hours as needed for nausea. 06/18/16   Rolland PorterMark Jerni Selmer, MD   BP 115/66 mmHg  Pulse 85  Temp(Src) 98.6 F (37 C) (Oral)  Resp 18  SpO2 100%  LMP 06/09/2016 Physical Exam  Constitutional: She is oriented to person, place, and time. She appears well-developed and well-nourished. No distress.  HENT:  Head: Normocephalic.  Eyes: Conjunctivae are normal. Pupils are equal, round, and reactive to  light. No scleral icterus.  Neck: Normal range of motion. Neck supple. No thyromegaly present.  Cardiovascular: Normal rate and regular rhythm.  Exam reveals no gallop and no friction rub.   No murmur heard. Pulmonary/Chest: Effort normal and breath sounds normal. No respiratory distress. She has no wheezes. She has no rales.  Abdominal: Soft. Bowel sounds are normal. She exhibits no distension. There is no tenderness. There is no rebound.  Musculoskeletal: Normal range of motion.  Neurological: She is alert and oriented to person, place, and time.  Skin: Skin is warm and dry. No rash noted.  Psychiatric: She has a normal mood and affect. Her behavior is normal.    ED Course  Procedures (including critical care time) Labs Review Labs Reviewed  COMPREHENSIVE METABOLIC PANEL - Abnormal; Notable for the following:    Glucose, Bld 117 (*)    Total Protein 8.6 (*)    Albumin 5.2 (*)    All other components within normal limits  URINALYSIS, ROUTINE W REFLEX MICROSCOPIC (NOT AT [facility]) - Abnormal; Notable for the following:    Color, Urine AMBER (*)    APPearance CLOUDY (*)    Bilirubin Urine SMALL (*)    Ketones, ur >80 (*)    Protein, ur 30 (*)    All other components within normal limits  URINE MICROSCOPIC-ADD ON - Abnormal; Notable for the following:    Squamous Epithelial / LPF 6-30 (*)    Bacteria, UA FEW (*)    Crystals CA OXALATE CRYSTALS (*)    All other components within normal limits  LIPASE, BLOOD  CBC  I-STAT BETA HCG BLOOD, ED ([facility], [facility], [facility] ONLY)    Imaging Review No results found. I have personally reviewed and evaluated these images and lab results as part of my medical decision-making.   EKG Interpretation None      MDM   Final diagnoses:  Non-intractable vomiting with nausea, vomiting of unspecified type    Taking by mouth after IV fluids and Zofran. Encouraged her to be compliant with her Prilosec. Primary care follow-up or GI referral as  needed.    Rolland PorterMark Hasaan Radde, MD 06/18/16 [phone]

## 2016-06-18 NOTE — ED Notes (Signed)
Pt was notified about Urinalysis 

## 2016-06-18 NOTE — Discharge Instructions (Signed)

## 2016-10-05 ENCOUNTER — Emergency Department (HOSPITAL_COMMUNITY)
Admission: EM | Admit: 2016-10-05 | Discharge: 2016-10-05 | Disposition: A | Discharge status: Home / Self Care | Payer: 59 | Attending: Emergency Medicine | Admitting: Emergency Medicine

## 2016-10-05 ENCOUNTER — Encounter (HOSPITAL_COMMUNITY): Payer: Self-pay

## 2016-10-05 DIAGNOSIS — Z87891 Personal history of nicotine dependence: Secondary | ICD-10-CM | POA: Insufficient documentation

## 2016-10-05 DIAGNOSIS — Z79899 Other long term (current) drug therapy: Secondary | ICD-10-CM | POA: Insufficient documentation

## 2016-10-05 DIAGNOSIS — R197 Diarrhea, unspecified: Secondary | ICD-10-CM | POA: Diagnosis not present

## 2016-10-05 DIAGNOSIS — R6889 Other general symptoms and signs: Secondary | ICD-10-CM

## 2016-10-05 DIAGNOSIS — R112 Nausea with vomiting, unspecified: Secondary | ICD-10-CM | POA: Insufficient documentation

## 2016-10-05 LAB — URINE MICROSCOPIC-ADD ON

## 2016-10-05 LAB — URINALYSIS, ROUTINE W REFLEX MICROSCOPIC
Bilirubin Urine: NEGATIVE
Glucose, UA: NEGATIVE mg/dL
HGB URINE DIPSTICK: NEGATIVE
Ketones, ur: >80 mg/dL — AB
LEUKOCYTES UA: NEGATIVE
NITRITE: NEGATIVE
PROTEIN: NEGATIVE mg/dL
Specific Gravity, Urine: 1.019 (ref 1.005–1.030)
pH: 8.5 — ABNORMAL HIGH (ref 5.0–8.0)

## 2016-10-05 LAB — CBC
HCT: 39 % (ref 36.0–46.0)
HEMOGLOBIN: 12.9 g/dL (ref 12.0–15.0)
MCH: 29.3 pg (ref 26.0–34.0)
MCHC: 33.1 g/dL (ref 30.0–36.0)
MCV: 88.6 fL (ref 78.0–100.0)
PLATELETS: 297 10*3/uL (ref 150–400)
RBC: 4.4 MIL/uL (ref 3.87–5.11)
RDW: 12.7 % (ref 11.5–15.5)
WBC: 12.7 10*3/uL — AB (ref 4.0–10.5)

## 2016-10-05 LAB — COMPREHENSIVE METABOLIC PANEL
ALK PHOS: 65 U/L (ref 38–126)
ALT: 15 U/L (ref 14–54)
ANION GAP: 9 (ref 5–15)
AST: 22 U/L (ref 15–41)
Albumin: 4.8 g/dL (ref 3.5–5.0)
BILIRUBIN TOTAL: 0.8 mg/dL (ref 0.3–1.2)
BUN: 10 mg/dL (ref 6–20)
CALCIUM: 9.7 mg/dL (ref 8.9–10.3)
CO2: 26 mmol/L (ref 22–32)
CREATININE: 0.74 mg/dL (ref 0.44–1.00)
Chloride: 105 mmol/L (ref 101–111)
Glucose, Bld: 139 mg/dL — ABNORMAL HIGH (ref 65–99)
Potassium: 4.3 mmol/L (ref 3.5–5.1)
Sodium: 140 mmol/L (ref 135–145)
TOTAL PROTEIN: 7.9 g/dL (ref 6.5–8.1)

## 2016-10-05 LAB — LIPASE, BLOOD: Lipase: 14 U/L (ref 11–51)

## 2016-10-05 LAB — POC URINE PREG, ED: PREG TEST UR: NEGATIVE

## 2016-10-05 MED ORDER — ONDANSETRON 4 MG PO TBDP: 4.0000 mg | ORAL_TABLET | Freq: Three times a day (TID) | ORAL | 0 refills | Status: DC | PRN

## 2016-10-05 MED ORDER — NAPROXEN 500 MG PO TABS: 500.0000 mg | ORAL_TABLET | Freq: Two times a day (BID) | ORAL | 0 refills | Status: DC

## 2016-10-05 MED ORDER — SODIUM CHLORIDE 0.9 % IV BOLUS (SEPSIS)
1000.0000 mL | Freq: Once | INTRAVENOUS | Status: AC
  Administered 2016-10-05: 1000 mL via INTRAVENOUS

## 2016-10-05 MED ORDER — ONDANSETRON HCL 4 MG/2ML IJ SOLN
4.0000 mg | Freq: Once | INTRAMUSCULAR | Status: AC
  Administered 2016-10-05: 4 mg via INTRAVENOUS
  Filled 2016-10-05: qty 2

## 2016-10-05 MED ORDER — KETOROLAC TROMETHAMINE 30 MG/ML IJ SOLN
30.0000 mg | Freq: Once | INTRAMUSCULAR | Status: AC
  Administered 2016-10-05: 30 mg via INTRAVENOUS
  Filled 2016-10-05: qty 1

## 2016-10-05 NOTE — ED Provider Notes (Signed)
[facility]-EMERGENCY DEPT Provider Note   CSN: [id number] Arrival date & time: 10/05/16  1421     History   Chief Complaint Chief Complaint  Patient presents with  . Emesis    HPI Crystal Wolfe is a 30 y.o. female.  HPI   Crystal Wolfe is a 30 y.o. female, patient with no pertinent past medical history, presenting to the ED with nausea, vomiting, and diarrhea since this morning. Also complains of chills and body aches. Endorses 14 episodes of emesis today. Denies abdominal pain, shortness of breath, cough, or any other complaints. No recent travel.    Past Medical History:  Diagnosis Date  . Pilonidal sinus with abscess April2013   drained April2013  . Ventral hernia     Patient Active Problem List   Diagnosis Date Noted  . Noninfectious gastroenteritis and colitis 04/02/2013  . Nausea & vomiting 04/01/2013  . Pilonidal abscess s/p I&D 2013 & 2014 03/24/2012  . Tobacco dependence 03/13/2012    Past Surgical History:  Procedure Laterality Date  . HERNIA REPAIR  June 2013   laparscopic  . INCISE AND DRAIN ABCESS  03/24/12   pilonidal abscess  . VENTRAL HERNIA REPAIR  05/2012   lap supraumb VWH repair    OB History    No data available       Home Medications    Prior to Admission medications   Medication Sig Start Date End Date Taking? Authorizing Provider  ondansetron (ZOFRAN ODT) 4 MG disintegrating tablet Take 1 tablet (4 mg total) by mouth every 8 (eight) hours as needed for nausea. 06/18/16  Yes Rolland PorterMark James, MD  Cholecalciferol (VITAMIN D) 2000 units CAPS Take 2,000 Units by mouth daily.    Historical Provider, MD  famotidine (PEPCID) 20 MG tablet Take 1 tablet (20 mg total) by mouth 2 (two) times daily. Patient not taking: Reported on 10/05/2016 04/20/16   Bethel BornKelly Marie Gekas, PA-C  metoCLOPramide (REGLAN) 10 MG tablet Take 1 tablet (10 mg total) by mouth 3 (three) times daily as needed for nausea (headache / nausea). Patient not taking: Reported on 10/05/2016  04/18/16   Garlon HatchetLisa M Sanders, PA-C  naproxen (NAPROSYN) 500 MG tablet Take 1 tablet (500 mg total) by mouth 2 (two) times daily. 10/05/16   Rayfield Beem C Asante Ritacco, PA-C  omeprazole (PRILOSEC) 40 MG capsule Take 40 mg by mouth daily as needed (heartburn).  05/19/16   Historical Provider, MD  ondansetron (ZOFRAN ODT) 4 MG disintegrating tablet Take 1 tablet (4 mg total) by mouth every 8 (eight) hours as needed for nausea or vomiting. 10/05/16   Marquee Fuchs C Edmund Holcomb, PA-C  promethazine (PHENERGAN) 25 MG suppository Place 25 mg rectally every 8 (eight) hours as needed for nausea or vomiting.    Historical Provider, MD    Family History Family History  Problem Relation Age of Onset  . Hyperlipidemia Father   . Hypertension Paternal Grandmother   . Diabetes Paternal Grandmother   . Thyroid disease Mother   . Cancer Maternal Aunt     breast  . Hypertension Maternal Grandmother     Social History Social History  Substance Use Topics  . Smoking status: Former Smoker    Packs/day: 0.20  . Smokeless tobacco: Never Used  . Alcohol use 0.6 oz/week    1 Glasses of wine per week     Comment: rarely     Allergies   Codeine and Penicillins   Review of Systems Review of Systems  Constitutional: Positive for chills. Negative for fever.  Respiratory: Negative for cough and shortness of breath.   Cardiovascular: Negative for chest pain.  Gastrointestinal: Positive for diarrhea, nausea and vomiting. Negative for abdominal pain and blood in stool.  Musculoskeletal: Positive for myalgias.  All other systems reviewed and are negative.    Physical Exam Updated Vital Signs BP 152/81   Pulse (!) 58   Temp 97.5 F (36.4 C)   Resp 16   Ht 5\' 6"  (1.676 m)   Wt 68 kg   LMP 09/21/2016 (Approximate)   SpO2 100%   BMI 24.21 kg/m   Physical Exam  Constitutional: She appears well-developed and well-nourished. No distress.  HENT:  Head: Normocephalic and atraumatic.  Mouth/Throat: Oropharynx is clear and moist.    Eyes: Conjunctivae are normal.  Neck: Neck supple.  Cardiovascular: Normal rate, regular rhythm, normal heart sounds and intact distal pulses.   Pulmonary/Chest: Effort normal and breath sounds normal. No respiratory distress.  Abdominal: Soft. There is no tenderness. There is no guarding.  Musculoskeletal: She exhibits no edema.  Lymphadenopathy:    She has no cervical adenopathy.  Neurological: She is alert.  Skin: Skin is warm and dry. She is not diaphoretic.  Psychiatric: She has a normal mood and affect. Her behavior is normal.  Nursing note and vitals reviewed.    ED Treatments / Results  Labs (all labs ordered are listed, but only abnormal results are displayed) Labs Reviewed  COMPREHENSIVE METABOLIC PANEL - Abnormal; Notable for the following:       Result Value   Glucose, Bld 139 (*)    All other components within normal limits  CBC - Abnormal; Notable for the following:    WBC 12.7 (*)    All other components within normal limits  URINALYSIS, ROUTINE W REFLEX MICROSCOPIC (NOT AT [facility]) - Abnormal; Notable for the following:    APPearance TURBID (*)    pH 8.5 (*)    Ketones, ur >80 (*)    All other components within normal limits  URINE MICROSCOPIC-ADD ON - Abnormal; Notable for the following:    Squamous Epithelial / LPF 6-30 (*)    Bacteria, UA MANY (*)    All other components within normal limits  LIPASE, BLOOD  POC URINE PREG, ED    EKG  EKG Interpretation None       Radiology No results found.  Procedures Procedures (including critical care time)  Medications Ordered in ED Medications  sodium chloride 0.9 % bolus 1,000 mL (0 mLs Intravenous Stopped 10/05/16 1856)  ondansetron (ZOFRAN) injection 4 mg (4 mg Intravenous Given 10/05/16 1652)  ketorolac (TORADOL) 30 MG/ML injection 30 mg (30 mg Intravenous Given 10/05/16 1652)     Initial Impression / Assessment and Plan / ED Course  I have reviewed the triage vital signs and the nursing  notes.  Pertinent labs & imaging results that were available during my care of the patient were reviewed by me and considered in my medical decision making (see chart for details).  Clinical Course    Patient presents with flulike symptoms beginning today. Patient is nontoxic appearing, afebrile, not tachycardic, not tachypneic, not hypotensive, maintains SPO2 of 100% on room air, and is in no apparent distress. Patient has no signs of sepsis or other serious or life-threatening condition. Some evidence of dehydration on UA. Patient able to readily pass an oral fluid challenge. The patient was given instructions for home care as well as return precautions. Patient voices understanding of these instructions, accepts the plan, and is  comfortable with discharge.  Vitals:   10/05/16 1433 10/05/16 1640 10/05/16 1857  BP: 152/81 157/84 126/65  Pulse: (!) 58 (!) 52 67  Resp: 16 18 18   Temp: 97.5 F (36.4 C)    SpO2: 100% 100% 100%  Weight: 68 kg    Height: 5\' 6"  (1.676 m)       Final Clinical Impressions(s) / ED Diagnoses   Final diagnoses:  Flu-like symptoms    New Prescriptions Discharge Medication List as of 10/05/2016  6:22 PM    START taking these medications   Details  naproxen (NAPROSYN) 500 MG tablet Take 1 tablet (500 mg total) by mouth 2 (two) times daily., Starting Tue 10/05/2016, Print    !! ondansetron (ZOFRAN ODT) 4 MG disintegrating tablet Take 1 tablet (4 mg total) by mouth every 8 (eight) hours as needed for nausea or vomiting., Starting Tue 10/05/2016, Print     !! - Potential duplicate medications found. Please discuss with provider.       Anselm Pancoast, PA-C 10/06/16 0048    Lyndal Pulley, MD 10/06/16 [phone]

## 2016-10-05 NOTE — ED Triage Notes (Signed)
Pt presents with c/o vomiting and diarrhea that has been going on since 7:30 this morning. Pt reports she has not been around anyone who has been sick. Pt reports she has vomited 10-12 times.

## 2016-10-05 NOTE — ED Notes (Signed)
Pt able to tolerate PO fluids and crackers without vomiting.

## 2016-10-05 NOTE — Discharge Instructions (Signed)
Your symptoms are consistent with a viral illness. Viruses do not require antibiotics. Treatment is symptomatic care and it is important to note that these symptoms may last for 7-10 days. Drink plenty of fluids and get plenty of rest. You should be drinking at least a one half to one liter of water an hour to stay hydrated. Ibuprofen, Naproxen, or Tylenol for pain or fever. Zofran for nausea. Warm liquids or Chloraseptic spray may help soothe a sore throat. Follow-up with a primary care provider for continued therapy.

## 2017-04-12 ENCOUNTER — Encounter (HOSPITAL_COMMUNITY): Payer: Self-pay

## 2017-04-12 ENCOUNTER — Emergency Department (HOSPITAL_COMMUNITY)
Admission: EM | Admit: 2017-04-12 | Discharge: 2017-04-12 | Disposition: A | Discharge status: Home / Self Care | Payer: 59 | Attending: Emergency Medicine | Admitting: Emergency Medicine

## 2017-04-12 DIAGNOSIS — K219 Gastro-esophageal reflux disease without esophagitis: Secondary | ICD-10-CM | POA: Insufficient documentation

## 2017-04-12 DIAGNOSIS — R1013 Epigastric pain: Secondary | ICD-10-CM | POA: Diagnosis present

## 2017-04-12 DIAGNOSIS — Z87891 Personal history of nicotine dependence: Secondary | ICD-10-CM | POA: Diagnosis not present

## 2017-04-12 LAB — COMPREHENSIVE METABOLIC PANEL
ALK PHOS: 60 U/L (ref 38–126)
ALT: 23 U/L (ref 14–54)
ANION GAP: 10 (ref 5–15)
AST: 52 U/L — ABNORMAL HIGH (ref 15–41)
Albumin: 4.6 g/dL (ref 3.5–5.0)
BUN: 14 mg/dL (ref 6–20)
CALCIUM: 9.5 mg/dL (ref 8.9–10.3)
CO2: 28 mmol/L (ref 22–32)
CREATININE: 0.81 mg/dL (ref 0.44–1.00)
Chloride: 97 mmol/L — ABNORMAL LOW (ref 101–111)
Glucose, Bld: 105 mg/dL — ABNORMAL HIGH (ref 65–99)
Potassium: 3.2 mmol/L — ABNORMAL LOW (ref 3.5–5.1)
Sodium: 135 mmol/L (ref 135–145)
TOTAL PROTEIN: 8.3 g/dL — AB (ref 6.5–8.1)
Total Bilirubin: 0.9 mg/dL (ref 0.3–1.2)

## 2017-04-12 LAB — CBC
HCT: 41.1 % (ref 36.0–46.0)
Hemoglobin: 14.4 g/dL (ref 12.0–15.0)
MCH: 30.1 pg (ref 26.0–34.0)
MCHC: 35 g/dL (ref 30.0–36.0)
MCV: 85.8 fL (ref 78.0–100.0)
PLATELETS: 322 10*3/uL (ref 150–400)
RBC: 4.79 MIL/uL (ref 3.87–5.11)
RDW: 11.8 % (ref 11.5–15.5)
WBC: 7.8 10*3/uL (ref 4.0–10.5)

## 2017-04-12 LAB — I-STAT BETA HCG BLOOD, ED (MC, WL, AP ONLY): I-stat hCG, quantitative: <5 m[IU]/mL (ref <5)

## 2017-04-12 LAB — LIPASE, BLOOD: Lipase: 31 U/L (ref 11–51)

## 2017-04-12 MED ORDER — SUCRALFATE 1 G PO TABS: 1.0000 g | ORAL_TABLET | Freq: Four times a day (QID) | ORAL | 0 refills | Status: DC

## 2017-04-12 MED ORDER — ONDANSETRON 8 MG PO TBDP
8.0000 mg | ORAL_TABLET | Freq: Once | ORAL | Status: AC
  Administered 2017-04-12: 8 mg via ORAL
  Filled 2017-04-12: qty 1

## 2017-04-12 MED ORDER — ONDANSETRON 8 MG PO TBDP: 8.0000 mg | ORAL_TABLET | Freq: Three times a day (TID) | ORAL | 0 refills | Status: DC | PRN

## 2017-04-12 NOTE — ED Triage Notes (Signed)
Pt reports emesis since Friday. She states that she has only been able to eat apple sauce and popsicles since then. However, today she attempted to eat a baked potato and vomited. Diarrhea started with vomiting but has since subsided. Denies abdominal pain. A&Ox4.

## 2017-04-12 NOTE — ED Provider Notes (Signed)
[facility]-EMERGENCY DEPT Provider Note   CSN: [id number] Arrival date & time: 04/12/17  1722     History   Chief Complaint Chief Complaint  Patient presents with  . Emesis    HPI Crystal Wolfe is a 31 y.o. female.  31 year old female presents with several year history of intermittent epigastric pain with associated emesis. Symptoms are worse with certain foods. Has been diagnosed with GERD and takes Protonix. Has never had a EGD before in the past and no fever or chills. Her emesis has been nonbilious or bloody. Was of acute onset fluids for a while but then when she had a potato he got worse. Denies any urinary or vaginal symptoms. Does feel slightly weak at this time. Denies any syncope or near syncope      Past Medical History:  Diagnosis Date  . Pilonidal sinus with abscess April2013   drained April2013  . Ventral hernia     Patient Active Problem List   Diagnosis Date Noted  . Noninfectious gastroenteritis and colitis 04/02/2013  . Nausea & vomiting 04/01/2013  . Pilonidal abscess s/p I&D 2013 & 2014 03/24/2012  . Tobacco dependence 03/13/2012    Past Surgical History:  Procedure Laterality Date  . HERNIA REPAIR  June 2013   laparscopic  . INCISE AND DRAIN ABCESS  03/24/12   pilonidal abscess  . VENTRAL HERNIA REPAIR  05/2012   lap supraumb VWH repair    OB History    No data available       Home Medications    Prior to Admission medications   Medication Sig Start Date End Date Taking? Authorizing Provider  Cholecalciferol (VITAMIN D) 2000 units CAPS Take 2,000 Units by mouth daily.    [provider]  famotidine (PEPCID) 20 MG tablet Take 1 tablet (20 mg total) by mouth 2 (two) times daily. Patient not taking: Reported on 10/05/2016 04/20/16   Bethel Born, PA-C  metoCLOPramide (REGLAN) 10 MG tablet Take 1 tablet (10 mg total) by mouth 3 (three) times daily as needed for nausea (headache / nausea). Patient not taking: Reported on 10/05/2016  04/18/16   Garlon Hatchet, PA-C  naproxen (NAPROSYN) 500 MG tablet Take 1 tablet (500 mg total) by mouth 2 (two) times daily. 10/05/16   Joy, Shawn C, PA-C  omeprazole (PRILOSEC) 40 MG capsule Take 40 mg by mouth daily as needed (heartburn).  05/19/16   [provider]  ondansetron (ZOFRAN ODT) 4 MG disintegrating tablet Take 1 tablet (4 mg total) by mouth every 8 (eight) hours as needed for nausea. 06/18/16   Rolland Porter, MD  ondansetron (ZOFRAN ODT) 4 MG disintegrating tablet Take 1 tablet (4 mg total) by mouth every 8 (eight) hours as needed for nausea or vomiting. 10/05/16   Joy, Shawn C, PA-C  promethazine (PHENERGAN) 25 MG suppository Place 25 mg rectally every 8 (eight) hours as needed for nausea or vomiting.    [provider]    Family History Family History  Problem Relation Age of Onset  . Hyperlipidemia Father   . Hypertension Paternal Grandmother   . Diabetes Paternal Grandmother   . Thyroid disease Mother   . Cancer Maternal Aunt     breast  . Hypertension Maternal Grandmother     Social History Social History  Substance Use Topics  . Smoking status: Former Smoker    Packs/day: 0.20  . Smokeless tobacco: Never Used  . Alcohol use 0.6 oz/week    1 Glasses of wine per week  Comment: rarely     Allergies   Codeine and Penicillins   Review of Systems Review of Systems  All other systems reviewed and are negative.    Physical Exam Updated Vital Signs BP (!) 135/92 (BP Location: Right Arm)   Pulse 77   Temp 99.2 F (37.3 C) (Oral)   Resp 18   Ht 5\' 6"  (1.676 m)   Wt 63.5 kg   SpO2 100%   BMI 22.60 kg/m   Physical Exam  Constitutional: She is oriented to person, place, and time. She appears well-developed and well-nourished.  Non-toxic appearance. No distress.  HENT:  Head: Normocephalic and atraumatic.  Eyes: Conjunctivae, EOM and lids are normal. Pupils are equal, round, and reactive to light.  Neck: Normal range of motion. Neck  supple. No tracheal deviation present. No thyroid mass present.  Cardiovascular: Normal rate, regular rhythm and normal heart sounds.  Exam reveals no gallop.   No murmur heard. Pulmonary/Chest: Effort normal and breath sounds normal. No stridor. No respiratory distress. She has no decreased breath sounds. She has no wheezes. She has no rhonchi. She has no rales.  Abdominal: Soft. Normal appearance and bowel sounds are normal. She exhibits no distension. There is no tenderness. There is no rebound and no CVA tenderness.  Musculoskeletal: Normal range of motion. She exhibits no edema or tenderness.  Neurological: She is alert and oriented to person, place, and time. She has normal strength. No cranial nerve deficit or sensory deficit. GCS eye subscore is 4. GCS verbal subscore is 5. GCS motor subscore is 6.  Skin: Skin is warm and dry. No abrasion and no rash noted.  Psychiatric: She has a normal mood and affect. Her speech is normal and behavior is normal.  Nursing note and vitals reviewed.    ED Treatments / Results  Labs (all labs ordered are listed, but only abnormal results are displayed) Labs Reviewed  COMPREHENSIVE METABOLIC PANEL - Abnormal; Notable for the following:       Result Value   Potassium 3.2 (*)    Chloride 97 (*)    Glucose, Bld 105 (*)    Total Protein 8.3 (*)    AST 52 (*)    All other components within normal limits  LIPASE, BLOOD  CBC  URINALYSIS, ROUTINE W REFLEX MICROSCOPIC  I-STAT BETA HCG BLOOD, ED ([facility], [facility], [facility] ONLY)    EKG  EKG Interpretation None       Radiology No results found.  Procedures Procedures (including critical care time)  Medications Ordered in ED Medications  ondansetron (ZOFRAN-ODT) disintegrating tablet 8 mg (not administered)     Initial Impression / Assessment and Plan / ED Course  I have reviewed the triage vital signs and the nursing notes.  Pertinent labs & imaging results that were available during my care of the  patient were reviewed by me and considered in my medical decision making (see chart for details).   patient treated with Zofran here. She has no clinical signs of dehydration. Suspect that her current symptoms are from her long-standing GERD. Will add Carafate to her regimen and she was instructed to continue taking her pro times and to follow-up with her gastroenterologist.  Final Clinical Impressions(s) / ED Diagnoses   Final diagnoses:  None    New Prescriptions New Prescriptions   No medications on file     Lorre NickAllen, Jaslynne Dahan, MD 04/12/17 1954

## 2017-08-08 ENCOUNTER — Emergency Department (HOSPITAL_COMMUNITY)
Admission: EM | Admit: 2017-08-08 | Discharge: 2017-08-09 | Disposition: A | Discharge status: Home / Self Care | Payer: 59 | Attending: Emergency Medicine | Admitting: Emergency Medicine

## 2017-08-08 ENCOUNTER — Encounter (HOSPITAL_COMMUNITY): Payer: Self-pay | Admitting: Emergency Medicine

## 2017-08-08 DIAGNOSIS — Z79899 Other long term (current) drug therapy: Secondary | ICD-10-CM | POA: Insufficient documentation

## 2017-08-08 DIAGNOSIS — Z87891 Personal history of nicotine dependence: Secondary | ICD-10-CM | POA: Diagnosis not present

## 2017-08-08 DIAGNOSIS — R112 Nausea with vomiting, unspecified: Secondary | ICD-10-CM | POA: Insufficient documentation

## 2017-08-08 HISTORY — DX: Gastro-esophageal reflux disease without esophagitis: K21.9

## 2017-08-08 LAB — COMPREHENSIVE METABOLIC PANEL
ALBUMIN: 4.3 g/dL (ref 3.5–5.0)
ALT: 15 U/L (ref 14–54)
ANION GAP: 8 (ref 5–15)
AST: 21 U/L (ref 15–41)
Alkaline Phosphatase: 63 U/L (ref 38–126)
BUN: 14 mg/dL (ref 6–20)
CHLORIDE: 101 mmol/L (ref 101–111)
CO2: 27 mmol/L (ref 22–32)
Calcium: 8.9 mg/dL (ref 8.9–10.3)
Creatinine, Ser: 0.8 mg/dL (ref 0.44–1.00)
GFR calc Af Amer: >60 mL/min (ref >60)
GFR calc non Af Amer: >60 mL/min (ref >60)
GLUCOSE: 126 mg/dL — AB (ref 65–99)
POTASSIUM: 3.6 mmol/L (ref 3.5–5.1)
SODIUM: 136 mmol/L (ref 135–145)
Total Bilirubin: 0.6 mg/dL (ref 0.3–1.2)
Total Protein: 7.7 g/dL (ref 6.5–8.1)

## 2017-08-08 LAB — CBC
HEMATOCRIT: 37.5 % (ref 36.0–46.0)
HEMOGLOBIN: 12.5 g/dL (ref 12.0–15.0)
MCH: 29.5 pg (ref 26.0–34.0)
MCHC: 33.3 g/dL (ref 30.0–36.0)
MCV: 88.4 fL (ref 78.0–100.0)
Platelets: 358 10*3/uL (ref 150–400)
RBC: 4.24 MIL/uL (ref 3.87–5.11)
RDW: 12.8 % (ref 11.5–15.5)
WBC: 11.7 10*3/uL — ABNORMAL HIGH (ref 4.0–10.5)

## 2017-08-08 LAB — I-STAT BETA HCG BLOOD, ED (MC, WL, AP ONLY): I-stat hCG, quantitative: <5 m[IU]/mL (ref <5)

## 2017-08-08 LAB — LIPASE, BLOOD: LIPASE: 27 U/L (ref 11–51)

## 2017-08-08 MED ORDER — KETOROLAC TROMETHAMINE 30 MG/ML IJ SOLN
30.0000 mg | Freq: Once | INTRAMUSCULAR | Status: AC
Start: 2017-08-08 — End: 2017-08-08
  Administered 2017-08-08: 30 mg via INTRAVENOUS
  Filled 2017-08-08: qty 1

## 2017-08-08 MED ORDER — PANTOPRAZOLE SODIUM 40 MG IV SOLR
40.0000 mg | Freq: Once | INTRAVENOUS | Status: AC
  Administered 2017-08-08: 40 mg via INTRAVENOUS
  Filled 2017-08-08: qty 40

## 2017-08-08 MED ORDER — SODIUM CHLORIDE 0.9 % IV BOLUS (SEPSIS)
1000.0000 mL | Freq: Once | INTRAVENOUS | Status: AC
  Administered 2017-08-08: 1000 mL via INTRAVENOUS

## 2017-08-08 MED ORDER — ONDANSETRON HCL 4 MG/2ML IJ SOLN
4.0000 mg | Freq: Once | INTRAMUSCULAR | Status: AC
  Administered 2017-08-08: 4 mg via INTRAVENOUS
  Filled 2017-08-08: qty 2

## 2017-08-08 NOTE — ED Triage Notes (Signed)
Pt states she started having vomiting tonight around 8pm  Pt is heaving in triage  Pt has hx of intractable nausea and vomiting  Pt's family states pt is supposed to take medicine once a day for her stomach and she has been out of it for some time now

## 2017-08-08 NOTE — ED Provider Notes (Signed)
[facility]-EMERGENCY DEPT Provider Note   CSN: [id number] Arrival date & time: 08/08/17  2136     History   Chief Complaint Chief Complaint  Patient presents with  . Emesis    HPI Crystal Wolfe is a 31 y.o. female.  Patient is a 31 year old female with history of noninfectious gastroenteritis/colitis, GERD, and episodes of recurrent emesis presenting for evaluation of nausea, vomiting for the past several hours. She reports eating a salad this evening that she believes made made her sick. She denies any fevers or chills. She does describe generalized abdominal cramping, however no focal pain. She denies any diarrhea or bloody stools.   The history is provided by the patient.  Emesis   This is a new problem. The current episode started 3 to 5 hours ago. The problem occurs continuously. The problem has been rapidly worsening. The emesis has an appearance of stomach contents. There has been no fever. Associated symptoms include abdominal pain. Pertinent negatives include no chills, no diarrhea and no fever.    Past Medical History:  Diagnosis Date  . GERD (gastroesophageal reflux disease)   . Pilonidal sinus with abscess April2013   drained April2013  . Ventral hernia     Patient Active Problem List   Diagnosis Date Noted  . Noninfectious gastroenteritis and colitis 04/02/2013  . Nausea & vomiting 04/01/2013  . Pilonidal abscess s/p I&D 2013 & 2014 03/24/2012  . Tobacco dependence 03/13/2012    Past Surgical History:  Procedure Laterality Date  . HERNIA REPAIR  June 2013   laparscopic  . INCISE AND DRAIN ABCESS  03/24/12   pilonidal abscess  . VENTRAL HERNIA REPAIR  05/2012   lap supraumb VWH repair    OB History    No data available       Home Medications    Prior to Admission medications   Medication Sig Start Date End Date Taking? Authorizing Provider  Cholecalciferol (VITAMIN D) 2000 units CAPS Take 2,000 Units by mouth daily.    [provider]    famotidine (PEPCID) 20 MG tablet Take 1 tablet (20 mg total) by mouth 2 (two) times daily. Patient not taking: Reported on 10/05/2016 04/20/16   Bethel Born, PA-C  metoCLOPramide (REGLAN) 10 MG tablet Take 1 tablet (10 mg total) by mouth 3 (three) times daily as needed for nausea (headache / nausea). Patient not taking: Reported on 10/05/2016 04/18/16   Garlon Hatchet, PA-C  naproxen (NAPROSYN) 500 MG tablet Take 1 tablet (500 mg total) by mouth 2 (two) times daily. 10/05/16   Joy, Shawn C, PA-C  omeprazole (PRILOSEC) 40 MG capsule Take 40 mg by mouth daily as needed (heartburn).  05/19/16   [provider]  ondansetron (ZOFRAN ODT) 4 MG disintegrating tablet Take 1 tablet (4 mg total) by mouth every 8 (eight) hours as needed for nausea. 06/18/16   Rolland Porter, MD  ondansetron (ZOFRAN ODT) 4 MG disintegrating tablet Take 1 tablet (4 mg total) by mouth every 8 (eight) hours as needed for nausea or vomiting. 10/05/16   Joy, Shawn C, PA-C  ondansetron (ZOFRAN ODT) 8 MG disintegrating tablet Take 1 tablet (8 mg total) by mouth every 8 (eight) hours as needed for nausea or vomiting. 04/12/17   Lorre Nick, MD  promethazine (PHENERGAN) 25 MG suppository Place 25 mg rectally every 8 (eight) hours as needed for nausea or vomiting.    [provider]  sucralfate (CARAFATE) 1 g tablet Take 1 tablet (1 g total) by mouth  4 (four) times daily. 04/12/17   Lorre NickAllen, Anthony, MD    Family History Family History  Problem Relation Age of Onset  . Hyperlipidemia Father   . Hypertension Paternal Grandmother   . Diabetes Paternal Grandmother   . Thyroid disease Mother   . Cancer Maternal Aunt        breast  . Hypertension Maternal Grandmother     Social History Social History  Substance Use Topics  . Smoking status: Former Smoker    Packs/day: 0.20  . Smokeless tobacco: Never Used  . Alcohol use 0.6 oz/week    1 Glasses of wine per week     Comment: rarely     Allergies   Codeine  and Penicillins   Review of Systems Review of Systems  Constitutional: Negative for chills and fever.  Gastrointestinal: Positive for abdominal pain and vomiting. Negative for diarrhea.  All other systems reviewed and are negative.    Physical Exam Updated Vital Signs BP 136/84 (BP Location: Right Arm)   Pulse 91   Temp 97.9 F (36.6 C) (Oral)   Resp 16   Ht 5\' 6"  (1.676 m)   Wt 63.5 kg (140 lb)   SpO2 100%   BMI 22.60 kg/m   Physical Exam  Constitutional: She is oriented to person, place, and time. She appears well-developed and well-nourished. No distress.  Patient appears uncomfortable. She is actively vomiting.  HENT:  Head: Normocephalic and atraumatic.  Neck: Normal range of motion. Neck supple.  Cardiovascular: Normal rate and regular rhythm.  Exam reveals no gallop and no friction rub.   No murmur heard. Pulmonary/Chest: Effort normal and breath sounds normal. No respiratory distress. She has no wheezes.  Abdominal: Soft. Bowel sounds are normal. She exhibits no distension. There is tenderness. There is no rebound and no guarding.  There is generalized abdominal tenderness. There is no focal tenderness.  Musculoskeletal: Normal range of motion.  Neurological: She is alert and oriented to person, place, and time.  Skin: Skin is warm and dry. She is not diaphoretic.  Nursing note and vitals reviewed.    ED Treatments / Results  Labs (all labs ordered are listed, but only abnormal results are displayed) Labs Reviewed  COMPREHENSIVE METABOLIC PANEL - Abnormal; Notable for the following:       Result Value   Glucose, Bld 126 (*)    All other components within normal limits  CBC - Abnormal; Notable for the following:    WBC 11.7 (*)    All other components within normal limits  LIPASE, BLOOD  URINALYSIS, ROUTINE W REFLEX MICROSCOPIC  I-STAT BETA HCG BLOOD, ED ([facility], [facility], [facility] ONLY)    EKG  EKG Interpretation None       Radiology No results  found.  Procedures Procedures (including critical care time)  Medications Ordered in ED Medications  ketorolac (TORADOL) 30 MG/ML injection 30 mg (not administered)  ondansetron (ZOFRAN) injection 4 mg (not administered)  sodium chloride 0.9 % bolus 1,000 mL (not administered)  pantoprazole (PROTONIX) injection 40 mg (not administered)     Initial Impression / Assessment and Plan / ED Course  I have reviewed the triage vital signs and the nursing notes.  Pertinent labs & imaging results that were available during my care of the patient were reviewed by me and considered in my medical decision making (see chart for details).  Patient seems to be tolerating by mouth and feeling better after medications given here in the ED. She has a history  of similar episodes related to recurrent noninfectious gastroenteritis. She will be discharged with clear liquids, Zofran, and return as needed. Her laboratory studies are reassuring.  Final Clinical Impressions(s) / ED Diagnoses   Final diagnoses:  None    New Prescriptions New Prescriptions   No medications on file     Geoffery Lyons, MD 08/09/17 [phone]

## 2017-08-08 NOTE — ED Notes (Signed)
Pt attempted to provide urine sample. Unable to do so at this time. Pt will try again after receiving IV fluids.

## 2017-08-09 LAB — URINALYSIS, ROUTINE W REFLEX MICROSCOPIC
Bilirubin Urine: NEGATIVE
GLUCOSE, UA: NEGATIVE mg/dL
Hgb urine dipstick: NEGATIVE
KETONES UR: 20 mg/dL — AB
LEUKOCYTES UA: NEGATIVE
Nitrite: NEGATIVE
PH: 7 (ref 5.0–8.0)
Protein, ur: NEGATIVE mg/dL
Specific Gravity, Urine: 1.012 (ref 1.005–1.030)

## 2017-08-09 MED ORDER — PROMETHAZINE HCL 25 MG/ML IJ SOLN
12.5000 mg | Freq: Once | INTRAMUSCULAR | Status: AC
  Administered 2017-08-09: 12.5 mg via INTRAVENOUS
  Filled 2017-08-09: qty 1

## 2017-08-09 MED ORDER — SODIUM CHLORIDE 0.9 % IV BOLUS (SEPSIS)
1000.0000 mL | Freq: Once | INTRAVENOUS | Status: AC
  Administered 2017-08-09: 1000 mL via INTRAVENOUS

## 2017-08-09 MED ORDER — METOCLOPRAMIDE HCL 5 MG/ML IJ SOLN
10.0000 mg | Freq: Once | INTRAMUSCULAR | Status: AC
  Administered 2017-08-09: 10 mg via INTRAVENOUS
  Filled 2017-08-09: qty 2

## 2017-08-09 MED ORDER — ONDANSETRON 8 MG PO TBDP: ORAL_TABLET | ORAL | 0 refills | Status: DC

## 2017-08-09 NOTE — Discharge Instructions (Signed)
Zofran as prescribed as needed for nausea.  Clear liquid diet for the next 12 hours, then slowly advance to crackers, toast, and bland foods.  Return to the emergency department if he develops severe abdominal pain, high fevers, bloody stools, or other new and concerning symptoms.

## 2017-08-09 NOTE — ED Notes (Signed)
Second attempt to collect urine unsuccessful.  RN notified.

## 2018-01-28 ENCOUNTER — Emergency Department (HOSPITAL_COMMUNITY)
Admission: EM | Admit: 2018-01-28 | Discharge: 2018-01-28 | Disposition: A | Discharge status: Home / Self Care | Payer: 59 | Attending: Physician Assistant | Admitting: Physician Assistant

## 2018-01-28 ENCOUNTER — Encounter (HOSPITAL_COMMUNITY): Payer: Self-pay | Admitting: Emergency Medicine

## 2018-01-28 DIAGNOSIS — Z87891 Personal history of nicotine dependence: Secondary | ICD-10-CM | POA: Diagnosis not present

## 2018-01-28 DIAGNOSIS — K047 Periapical abscess without sinus: Secondary | ICD-10-CM | POA: Diagnosis not present

## 2018-01-28 DIAGNOSIS — K0889 Other specified disorders of teeth and supporting structures: Secondary | ICD-10-CM | POA: Diagnosis present

## 2018-01-28 MED ORDER — CLINDAMYCIN HCL 150 MG PO CAPS: 300.0000 mg | ORAL_CAPSULE | Freq: Four times a day (QID) | ORAL | 0 refills | Status: AC

## 2018-01-28 MED ORDER — CLINDAMYCIN HCL 300 MG PO CAPS
300.0000 mg | ORAL_CAPSULE | Freq: Once | ORAL | Status: AC
  Administered 2018-01-28: 300 mg via ORAL
  Filled 2018-01-28: qty 1

## 2018-01-28 MED ORDER — FLUCONAZOLE 200 MG PO TABS: 200.0000 mg | ORAL_TABLET | Freq: Once | ORAL | 0 refills | Status: AC

## 2018-01-28 NOTE — ED Provider Notes (Signed)
[facility]-EMERGENCY DEPT Provider Note   CSN: [id number] Arrival date & time: 01/28/18  0609     History   Chief Complaint Chief Complaint  Patient presents with  . Facial Swelling    right    HPI Latrice Storlie is a 32 y.o. female.  HPI   Patient is a 32 year old female presenting with swelling to the right face.  Patient thought was dental infection and made a follow-up appointment with a dentist this week.  However they told her to come here to get antibiotics.  Patient has no fever.  No trouble swallowing.  Patient has multiple broken teeth on both upper and lower jaw  Past Medical History:  Diagnosis Date  . GERD (gastroesophageal reflux disease)   . Pilonidal sinus with abscess April2013   drained April2013  . Ventral hernia     Patient Active Problem List   Diagnosis Date Noted  . Noninfectious gastroenteritis and colitis 04/02/2013  . Nausea & vomiting 04/01/2013  . Pilonidal abscess s/p I&D 2013 & 2014 03/24/2012  . Tobacco dependence 03/13/2012    Past Surgical History:  Procedure Laterality Date  . HERNIA REPAIR  June 2013   laparscopic  . INCISE AND DRAIN ABCESS  03/24/12   pilonidal abscess  . VENTRAL HERNIA REPAIR  05/2012   lap supraumb VWH repair    OB History    No data available       Home Medications    Prior to Admission medications   Medication Sig Start Date End Date Taking? Authorizing Provider  clindamycin (CLEOCIN) 150 MG capsule Take 2 capsules (300 mg total) by mouth 4 (four) times daily for 7 days. 01/28/18 02/04/18  Dellene Mcgroarty Lyn, MD  famotidine (PEPCID) 20 MG tablet Take 1 tablet (20 mg total) by mouth 2 (two) times daily. Patient not taking: Reported on 10/05/2016 04/20/16   Bethel Born, PA-C  fluconazole (DIFLUCAN) 200 MG tablet Take 1 tablet (200 mg total) by mouth once for 1 dose. 01/28/18 01/28/18  Lino Wickliff Lyn, MD  metoCLOPramide (REGLAN) 10 MG tablet Take 1 tablet (10 mg total)  by mouth 3 (three) times daily as needed for nausea (headache / nausea). Patient not taking: Reported on 10/05/2016 04/18/16   Garlon Hatchet, PA-C  naproxen (NAPROSYN) 500 MG tablet Take 1 tablet (500 mg total) by mouth 2 (two) times daily. Patient not taking: Reported on 08/08/2017 10/05/16   Harolyn Rutherford C, PA-C  ondansetron (ZOFRAN ODT) 8 MG disintegrating tablet 8mg  ODT q4 hours prn nausea 08/09/17   Geoffery Lyons, MD  sucralfate (CARAFATE) 1 g tablet Take 1 tablet (1 g total) by mouth 4 (four) times daily. Patient not taking: Reported on 08/08/2017 04/12/17   Lorre Nick, MD    Family History Family History  Problem Relation Age of Onset  . Hyperlipidemia Father   . Hypertension Paternal Grandmother   . Diabetes Paternal Grandmother   . Thyroid disease Mother   . Cancer Maternal Aunt        breast  . Hypertension Maternal Grandmother     Social History Social History   Tobacco Use  . Smoking status: Former Smoker    Packs/day: 0.20  . Smokeless tobacco: Never Used  Substance Use Topics  . Alcohol use: Yes    Alcohol/week: 0.6 oz    Types: 1 Glasses of wine per week    Comment: rarely  . Drug use: Yes    Frequency: 7.0 times per week  Types: Marijuana    Comment: daily use     Allergies   Codeine and Penicillins   Review of Systems Review of Systems  Constitutional: Negative for activity change.  Respiratory: Negative for shortness of breath.   Cardiovascular: Negative for chest pain.  Gastrointestinal: Negative for abdominal pain.     Physical Exam Updated Vital Signs BP 123/76 (BP Location: Left Arm)   Pulse 73   Temp 98.4 F (36.9 C) (Oral)   Resp 18   Ht 5\' 6"  (1.676 m)   Wt 68 kg (150 lb)   LMP 01/03/2018   SpO2 100%   BMI 24.21 kg/m   Physical Exam  Constitutional: She is oriented to person, place, and time. She appears well-developed and well-nourished.  HENT:  Head: Normocephalic and atraumatic.  Mouth/Throat: Oropharynx is clear and  moist.  No drainable abscess.  Swelling on the right face consistent with dental infection.  Multiple broken teeth on upper and lower jaw  Mild tenderness when tapping right back molar.  Eyes: Right eye exhibits no discharge. Left eye exhibits no discharge.  Cardiovascular: Normal rate.  Pulmonary/Chest: Effort normal.  Neurological: She is oriented to person, place, and time.  Skin: Skin is warm and dry. She is not diaphoretic.  Psychiatric: She has a normal mood and affect.  Nursing note and vitals reviewed.    ED Treatments / Results  Labs (all labs ordered are listed, but only abnormal results are displayed) Labs Reviewed - No data to display  EKG  EKG Interpretation None       Radiology No results found.  Procedures Procedures (including critical care time)  Medications Ordered in ED Medications  clindamycin (CLEOCIN) capsule 300 mg (not administered)     Initial Impression / Assessment and Plan / ED Course  I have reviewed the triage vital signs and the nursing notes.  Pertinent labs & imaging results that were available during my care of the patient were reviewed by me and considered in my medical decision making (see chart for details).     Patient is a 32 year old female presenting with swelling to the right face.  Patient thought was dental infection and made a follow-up appointment with a dentist this week.  However they told her to come here to get antibiotics.  Patient has no fever.  No trouble swallowing.  Patient has multiple broken teeth on both upper and lower jaw     Final Clinical Impressions(s) / ED Diagnoses   Final diagnoses:  Dental infection    ED Discharge Orders        Ordered    clindamycin (CLEOCIN) 150 MG capsule  4 times daily     01/28/18 0949    fluconazole (DIFLUCAN) 200 MG tablet   Once     01/28/18 0949       Abelino DerrickMackuen, Albino Bufford Lyn, MD 01/28/18 [phone]

## 2018-01-28 NOTE — ED Triage Notes (Signed)
Patient reports that she woke up yesterday morning with right side facial swelling and still continued. Patient tried taking Naproxen yesterday.

## 2018-01-28 NOTE — ED Notes (Signed)
Pt with rt side swelling of face due to broken top broken molars, unable to get dental appointment but has called.

## 2018-01-28 NOTE — Discharge Instructions (Signed)
We think you likely have an infection in your teeth.  Please continue to follow-up as planned this week.  Here are some antibiotics that should take until your follow-up appointment.  We have given you a pill for yeast in case you develop yeast infection after the use of the antibiotics.

## 2018-04-14 ENCOUNTER — Emergency Department (HOSPITAL_COMMUNITY)
Admission: EM | Admit: 2018-04-14 | Discharge: 2018-04-15 | Disposition: A | Discharge status: Home / Self Care | Payer: 59 | Attending: Emergency Medicine | Admitting: Emergency Medicine

## 2018-04-14 ENCOUNTER — Encounter (HOSPITAL_COMMUNITY): Payer: Self-pay | Admitting: Emergency Medicine

## 2018-04-14 DIAGNOSIS — Z5321 Procedure and treatment not carried out due to patient leaving prior to being seen by health care provider: Secondary | ICD-10-CM | POA: Insufficient documentation

## 2018-04-14 DIAGNOSIS — R197 Diarrhea, unspecified: Secondary | ICD-10-CM | POA: Insufficient documentation

## 2018-04-14 DIAGNOSIS — R112 Nausea with vomiting, unspecified: Secondary | ICD-10-CM | POA: Insufficient documentation

## 2018-04-14 DIAGNOSIS — R110 Nausea: Secondary | ICD-10-CM | POA: Diagnosis present

## 2018-04-14 LAB — COMPREHENSIVE METABOLIC PANEL
ALBUMIN: 4.8 g/dL (ref 3.5–5.0)
ALK PHOS: 65 U/L (ref 38–126)
ALT: 21 U/L (ref 14–54)
ANION GAP: 13 (ref 5–15)
AST: 27 U/L (ref 15–41)
BILIRUBIN TOTAL: 0.5 mg/dL (ref 0.3–1.2)
BUN: 16 mg/dL (ref 6–20)
CALCIUM: 9.8 mg/dL (ref 8.9–10.3)
CO2: 26 mmol/L (ref 22–32)
Chloride: 99 mmol/L — ABNORMAL LOW (ref 101–111)
Creatinine, Ser: 0.89 mg/dL (ref 0.44–1.00)
GFR calc non Af Amer: >60 mL/min (ref >60)
GLUCOSE: 134 mg/dL — AB (ref 65–99)
POTASSIUM: 3.6 mmol/L (ref 3.5–5.1)
Sodium: 138 mmol/L (ref 135–145)
Total Protein: 8.8 g/dL — ABNORMAL HIGH (ref 6.5–8.1)

## 2018-04-14 LAB — CBC
HEMATOCRIT: 39.9 % (ref 36.0–46.0)
HEMOGLOBIN: 13.2 g/dL (ref 12.0–15.0)
MCH: 29.7 pg (ref 26.0–34.0)
MCHC: 33.1 g/dL (ref 30.0–36.0)
MCV: 89.7 fL (ref 78.0–100.0)
Platelets: 371 10*3/uL (ref 150–400)
RBC: 4.45 MIL/uL (ref 3.87–5.11)
RDW: 12.8 % (ref 11.5–15.5)
WBC: 12.1 10*3/uL — AB (ref 4.0–10.5)

## 2018-04-14 LAB — URINALYSIS, ROUTINE W REFLEX MICROSCOPIC
BILIRUBIN URINE: NEGATIVE
GLUCOSE, UA: NEGATIVE mg/dL
HGB URINE DIPSTICK: NEGATIVE
KETONES UR: 20 mg/dL — AB
NITRITE: NEGATIVE
PH: 6 (ref 5.0–8.0)
Protein, ur: 100 mg/dL — AB
Specific Gravity, Urine: 1.023 (ref 1.005–1.030)

## 2018-04-14 LAB — LIPASE, BLOOD: Lipase: 64 U/L — ABNORMAL HIGH (ref 11–51)

## 2018-04-14 LAB — I-STAT BETA HCG BLOOD, ED (MC, WL, AP ONLY): I-stat hCG, quantitative: <5 m[IU]/mL (ref <5)

## 2018-04-14 NOTE — ED Triage Notes (Signed)
Pt repot yesterday got very nauseated and had diarrhea, so left work and went home. Reports vomiting but no more diarrhea today. Denies pains at this time or urinary problems.

## 2018-04-15 ENCOUNTER — Encounter (HOSPITAL_COMMUNITY): Payer: Self-pay | Admitting: Emergency Medicine

## 2018-04-15 ENCOUNTER — Other Ambulatory Visit: Payer: Self-pay

## 2018-04-15 ENCOUNTER — Emergency Department (HOSPITAL_COMMUNITY)
Admission: EM | Admit: 2018-04-15 | Discharge: 2018-04-16 | Disposition: A | Discharge status: Home / Self Care | Payer: 59 | Source: Home / Self Care | Attending: Emergency Medicine | Admitting: Emergency Medicine

## 2018-04-15 DIAGNOSIS — R112 Nausea with vomiting, unspecified: Secondary | ICD-10-CM | POA: Insufficient documentation

## 2018-04-15 DIAGNOSIS — Z87891 Personal history of nicotine dependence: Secondary | ICD-10-CM

## 2018-04-15 LAB — CBC
HCT: 41.1 % (ref 36.0–46.0)
Hemoglobin: 13.7 g/dL (ref 12.0–15.0)
MCH: 29.7 pg (ref 26.0–34.0)
MCHC: 33.3 g/dL (ref 30.0–36.0)
MCV: 89.2 fL (ref 78.0–100.0)
PLATELETS: 400 10*3/uL (ref 150–400)
RBC: 4.61 MIL/uL (ref 3.87–5.11)
RDW: 12.7 % (ref 11.5–15.5)
WBC: 9.9 10*3/uL (ref 4.0–10.5)

## 2018-04-15 LAB — COMPREHENSIVE METABOLIC PANEL
ALK PHOS: 66 U/L (ref 38–126)
ALT: 19 U/L (ref 14–54)
AST: 24 U/L (ref 15–41)
Albumin: 4.7 g/dL (ref 3.5–5.0)
Anion gap: 12 (ref 5–15)
BUN: 14 mg/dL (ref 6–20)
CALCIUM: 9.6 mg/dL (ref 8.9–10.3)
CHLORIDE: 91 mmol/L — AB (ref 101–111)
CO2: 31 mmol/L (ref 22–32)
CREATININE: 0.86 mg/dL (ref 0.44–1.00)
GFR calc Af Amer: >60 mL/min (ref >60)
GFR calc non Af Amer: >60 mL/min (ref >60)
Glucose, Bld: 125 mg/dL — ABNORMAL HIGH (ref 65–99)
Potassium: 3.5 mmol/L (ref 3.5–5.1)
Sodium: 134 mmol/L — ABNORMAL LOW (ref 135–145)
Total Bilirubin: 0.8 mg/dL (ref 0.3–1.2)
Total Protein: 8.6 g/dL — ABNORMAL HIGH (ref 6.5–8.1)

## 2018-04-15 LAB — URINALYSIS, ROUTINE W REFLEX MICROSCOPIC
Bacteria, UA: NONE SEEN
Bilirubin Urine: NEGATIVE
Glucose, UA: NEGATIVE mg/dL
Hgb urine dipstick: NEGATIVE
KETONES UR: 5 mg/dL — AB
Nitrite: NEGATIVE
PH: 6 (ref 5.0–8.0)
PROTEIN: 100 mg/dL — AB
Specific Gravity, Urine: 1.027 (ref 1.005–1.030)

## 2018-04-15 LAB — I-STAT BETA HCG BLOOD, ED (MC, WL, AP ONLY): I-stat hCG, quantitative: <5 m[IU]/mL (ref <5)

## 2018-04-15 LAB — LIPASE, BLOOD: LIPASE: 47 U/L (ref 11–51)

## 2018-04-15 MED ORDER — ONDANSETRON HCL 4 MG/2ML IJ SOLN
4.0000 mg | Freq: Once | INTRAMUSCULAR | Status: AC
  Administered 2018-04-15: 4 mg via INTRAVENOUS
  Filled 2018-04-15: qty 2

## 2018-04-15 MED ORDER — SODIUM CHLORIDE 0.9 % IV BOLUS
1000.0000 mL | Freq: Once | INTRAVENOUS | Status: AC
  Administered 2018-04-15: 1000 mL via INTRAVENOUS

## 2018-04-15 MED ORDER — ONDANSETRON 4 MG PO TBDP
4.0000 mg | ORAL_TABLET | Freq: Once | ORAL | Status: AC | PRN
  Administered 2018-04-15: 4 mg via ORAL
  Filled 2018-04-15: qty 1

## 2018-04-15 NOTE — ED Triage Notes (Signed)
Pt c/o nausea/vomiting/intermittent abdominal pain since Thursday. Denies urinary symptoms.

## 2018-04-16 MED ORDER — PROMETHAZINE HCL 25 MG PO TABS: 25.0000 mg | ORAL_TABLET | Freq: Four times a day (QID) | ORAL | 0 refills | Status: DC | PRN

## 2018-04-16 NOTE — ED Provider Notes (Signed)
[facility] EMERGENCY DEPARTMENT Provider Note   CSN: [id number] Arrival date & time: 04/15/18  1755     History   Chief Complaint Chief Complaint  Patient presents with  . Emesis    HPI Crystal Wolfe is a 32 y.o. female.  Patient here with vomiting for the past 4 days. No fever, abdominal pain, diarrhea. No known sick contacts or suspected food toxins. She denies SOB, chest pain. She has been unable to keep and food or liquids down since symptoms started. No syncope but she reports mild lightheadedness when standing and generalized weakness.   The history is provided by the patient. No language interpreter was used.    Past Medical History:  Diagnosis Date  . GERD (gastroesophageal reflux disease)   . Pilonidal sinus with abscess April2013   drained April2013  . Ventral hernia     Patient Active Problem List   Diagnosis Date Noted  . Noninfectious gastroenteritis and colitis 04/02/2013  . Nausea & vomiting 04/01/2013  . Pilonidal abscess s/p I&D 2013 & 2014 03/24/2012  . Tobacco dependence 03/13/2012    Past Surgical History:  Procedure Laterality Date  . HERNIA REPAIR  June 2013   laparscopic  . INCISE AND DRAIN ABCESS  03/24/12   pilonidal abscess  . VENTRAL HERNIA REPAIR  05/2012   lap supraumb VWH repair     OB History   None      Home Medications    Prior to Admission medications   Medication Sig Start Date End Date Taking? Authorizing Provider  ondansetron (ZOFRAN ODT) 8 MG disintegrating tablet  ODT q4 hours prn nausea 08/09/17  Yes Geoffery Lyons, MD    Family History Family History  Problem Relation Age of Onset  . Hyperlipidemia Father   . Hypertension Paternal Grandmother   . Diabetes Paternal Grandmother   . Thyroid disease Mother   . Cancer Maternal Aunt        breast  . Hypertension Maternal Grandmother     Social History Social History   Tobacco Use  . Smoking status: Former Smoker    Packs/day: 0.20  .  Smokeless tobacco: Never Used  Substance Use Topics  . Alcohol use: Yes    Alcohol/week: 0.6 oz    Types: 1 Glasses of wine per week    Comment: rarely  . Drug use: Yes    Frequency: 7.0 times per week    Types: Marijuana    Comment: daily use     Allergies   Codeine and Penicillins   Review of Systems Review of Systems  Constitutional: Negative for chills and fever.  Respiratory: Negative.  Negative for shortness of breath.   Cardiovascular: Negative.  Negative for chest pain.  Gastrointestinal: Positive for nausea and vomiting. Negative for abdominal pain and diarrhea.       No hematemesis  Genitourinary: Negative.  Negative for dysuria and frequency.  Musculoskeletal: Negative.  Negative for myalgias.  Skin: Negative.   Neurological: Positive for weakness and light-headedness. Negative for syncope.     Physical Exam Updated Vital Signs BP (!) 155/85   Pulse 72   Temp 99.2 F (37.3 C) (Oral)   Resp 18   LMP 04/03/2018   SpO2 97%   Physical Exam  Constitutional: She is oriented to person, place, and time. She appears well-developed and well-nourished.  HENT:  Head: Normocephalic.  Mouth/Throat: Mucous membranes are dry.  Neck: Normal range of motion. Neck supple.  Cardiovascular: Normal rate and regular rhythm.  Pulmonary/Chest: Effort normal and breath sounds normal. She has no wheezes. She has no rales.  Abdominal: Soft. Bowel sounds are normal. There is no tenderness. There is no rebound and no guarding.  Musculoskeletal: Normal range of motion.  Neurological: She is alert and oriented to person, place, and time.  Skin: Skin is warm and dry. No rash noted.  Psychiatric: She has a normal mood and affect.  Nursing note and vitals reviewed.    ED Treatments / Results  Labs (all labs ordered are listed, but only abnormal results are displayed) Labs Reviewed  COMPREHENSIVE METABOLIC PANEL - Abnormal; Notable for the following components:      Result Value    Sodium 134 (*)    Chloride 91 (*)    Glucose, Bld 125 (*)    Total Protein 8.6 (*)    All other components within normal limits  URINALYSIS, ROUTINE W REFLEX MICROSCOPIC - Abnormal; Notable for the following components:   Color, Urine AMBER (*)    APPearance CLOUDY (*)    Ketones, ur 5 (*)    Protein, ur 100 (*)    Leukocytes, UA MODERATE (*)    All other components within normal limits  LIPASE, BLOOD  CBC  I-STAT BETA HCG BLOOD, ED ([facility], [facility], [facility] ONLY)    EKG None  Radiology No results found.  Procedures Procedures (including critical care time)  Medications Ordered in ED Medications  ondansetron (ZOFRAN-ODT) disintegrating tablet 4 mg (4 mg Oral Given 04/15/18 1843)  sodium chloride 0.9 % bolus 1,000 mL (1,000 mLs Intravenous New Bag/Given 04/15/18 2339)  ondansetron (ZOFRAN) injection 4 mg (4 mg Intravenous Given 04/15/18 2339)     Initial Impression / Assessment and Plan / ED Course  I have reviewed the triage vital signs and the nursing notes.  Pertinent labs & imaging results that were available during my care of the patient were reviewed by me and considered in my medical decision making (see chart for details).     Patient here with 4 days of vomiting, unable to tolerate PO intake, no fever or pain.   IVF"s provided in the ED. Phenergan as Zofran was less effective and she feels her nausea is improved. She is tolerating PO fluids without further vomiting.   Labs reassuring. She is here with parents and feels comfortable with discharge home. All questions and concerns answered.   Final Clinical Impressions(s) / ED Diagnoses   Final diagnoses:  None   1. Nausea and vomiting  ED Discharge Orders    None       Elpidio Anis, PA-C 04/16/18 0118    Tegeler, Canary Brim, MD 04/16/18 [phone]

## 2018-09-03 ENCOUNTER — Other Ambulatory Visit: Payer: Self-pay

## 2018-09-03 ENCOUNTER — Encounter (HOSPITAL_COMMUNITY): Payer: Self-pay | Admitting: *Deleted

## 2018-09-03 ENCOUNTER — Emergency Department (HOSPITAL_COMMUNITY)
Admission: EM | Admit: 2018-09-03 | Discharge: 2018-09-03 | Disposition: A | Discharge status: Home / Self Care | Payer: 59 | Attending: Emergency Medicine | Admitting: Emergency Medicine

## 2018-09-03 DIAGNOSIS — R109 Unspecified abdominal pain: Secondary | ICD-10-CM | POA: Insufficient documentation

## 2018-09-03 DIAGNOSIS — Z5321 Procedure and treatment not carried out due to patient leaving prior to being seen by health care provider: Secondary | ICD-10-CM | POA: Insufficient documentation

## 2018-09-03 LAB — COMPREHENSIVE METABOLIC PANEL
ALK PHOS: 67 U/L (ref 38–126)
ALT: 15 U/L (ref 0–44)
AST: 22 U/L (ref 15–41)
Albumin: 4.3 g/dL (ref 3.5–5.0)
Anion gap: 10 (ref 5–15)
BILIRUBIN TOTAL: 0.9 mg/dL (ref 0.3–1.2)
BUN: 14 mg/dL (ref 6–20)
CO2: 26 mmol/L (ref 22–32)
CREATININE: 0.72 mg/dL (ref 0.44–1.00)
Calcium: 9.6 mg/dL (ref 8.9–10.3)
Chloride: 104 mmol/L (ref 98–111)
GFR calc Af Amer: >60 mL/min (ref >60)
GFR calc non Af Amer: >60 mL/min (ref >60)
GLUCOSE: 132 mg/dL — AB (ref 70–99)
POTASSIUM: 4.4 mmol/L (ref 3.5–5.1)
Sodium: 140 mmol/L (ref 135–145)
TOTAL PROTEIN: 7.8 g/dL (ref 6.5–8.1)

## 2018-09-03 LAB — CBC
HCT: 39 % (ref 36.0–46.0)
Hemoglobin: 12.6 g/dL (ref 12.0–15.0)
MCH: 28.5 pg (ref 26.0–34.0)
MCHC: 32.3 g/dL (ref 30.0–36.0)
MCV: 88.2 fL (ref 78.0–100.0)
PLATELETS: 332 10*3/uL (ref 150–400)
RBC: 4.42 MIL/uL (ref 3.87–5.11)
RDW: 14.3 % (ref 11.5–15.5)
WBC: 9.2 10*3/uL (ref 4.0–10.5)

## 2018-09-03 LAB — I-STAT BETA HCG BLOOD, ED (MC, WL, AP ONLY): I-stat hCG, quantitative: <5 m[IU]/mL (ref <5)

## 2018-09-03 LAB — LIPASE, BLOOD: Lipase: 27 U/L (ref 11–51)

## 2018-09-03 NOTE — ED Notes (Signed)
Pt unable to urinate at this time.  

## 2018-09-03 NOTE — ED Notes (Signed)
Pt called for room with no answer. 

## 2018-09-03 NOTE — ED Triage Notes (Signed)
Pt with onset of abdominal pain, nausea and vomiting this morning

## 2018-09-06 ENCOUNTER — Emergency Department (HOSPITAL_COMMUNITY): Payer: 59

## 2018-09-06 ENCOUNTER — Emergency Department (HOSPITAL_COMMUNITY)
Admission: EM | Admit: 2018-09-06 | Discharge: 2018-09-06 | Disposition: A | Discharge status: Home / Self Care | Payer: 59 | Attending: Emergency Medicine | Admitting: Emergency Medicine

## 2018-09-06 ENCOUNTER — Encounter (HOSPITAL_COMMUNITY): Payer: Self-pay | Admitting: Emergency Medicine

## 2018-09-06 DIAGNOSIS — Z87891 Personal history of nicotine dependence: Secondary | ICD-10-CM | POA: Insufficient documentation

## 2018-09-06 DIAGNOSIS — K529 Noninfective gastroenteritis and colitis, unspecified: Secondary | ICD-10-CM | POA: Diagnosis not present

## 2018-09-06 DIAGNOSIS — R1084 Generalized abdominal pain: Secondary | ICD-10-CM

## 2018-09-06 DIAGNOSIS — R1111 Vomiting without nausea: Secondary | ICD-10-CM | POA: Diagnosis present

## 2018-09-06 LAB — URINALYSIS, ROUTINE W REFLEX MICROSCOPIC
Bilirubin Urine: NEGATIVE
GLUCOSE, UA: NEGATIVE mg/dL
HGB URINE DIPSTICK: NEGATIVE
Ketones, ur: 80 mg/dL — AB
Leukocytes, UA: NEGATIVE
Nitrite: NEGATIVE
PROTEIN: NEGATIVE mg/dL
Specific Gravity, Urine: >1.046 — ABNORMAL HIGH (ref 1.005–1.030)
pH: 7 (ref 5.0–8.0)

## 2018-09-06 LAB — COMPREHENSIVE METABOLIC PANEL
ALK PHOS: 68 U/L (ref 38–126)
ALT: 15 U/L (ref 0–44)
AST: 23 U/L (ref 15–41)
Albumin: 4.5 g/dL (ref 3.5–5.0)
Anion gap: 14 (ref 5–15)
BUN: 20 mg/dL (ref 6–20)
CALCIUM: 9.8 mg/dL (ref 8.9–10.3)
CHLORIDE: 96 mmol/L — AB (ref 98–111)
CO2: 28 mmol/L (ref 22–32)
CREATININE: 0.85 mg/dL (ref 0.44–1.00)
GFR calc Af Amer: >60 mL/min (ref >60)
Glucose, Bld: 98 mg/dL (ref 70–99)
Potassium: 4 mmol/L (ref 3.5–5.1)
SODIUM: 138 mmol/L (ref 135–145)
Total Bilirubin: 1.1 mg/dL (ref 0.3–1.2)
Total Protein: 8.1 g/dL (ref 6.5–8.1)

## 2018-09-06 LAB — CBC
HCT: 44 % (ref 36.0–46.0)
Hemoglobin: 14.6 g/dL (ref 12.0–15.0)
MCH: 29.3 pg (ref 26.0–34.0)
MCHC: 33.2 g/dL (ref 30.0–36.0)
MCV: 88.2 fL (ref 78.0–100.0)
Platelets: 382 10*3/uL (ref 150–400)
RBC: 4.99 MIL/uL (ref 3.87–5.11)
RDW: 13.6 % (ref 11.5–15.5)
WBC: 7.2 10*3/uL (ref 4.0–10.5)

## 2018-09-06 LAB — I-STAT BETA HCG BLOOD, ED (MC, WL, AP ONLY)

## 2018-09-06 LAB — LIPASE, BLOOD: LIPASE: 27 U/L (ref 11–51)

## 2018-09-06 MED ORDER — ONDANSETRON 4 MG PO TBDP: 4.0000 mg | ORAL_TABLET | Freq: Three times a day (TID) | ORAL | 0 refills | Status: DC | PRN

## 2018-09-06 MED ORDER — MORPHINE SULFATE (PF) 4 MG/ML IV SOLN
4.0000 mg | Freq: Once | INTRAVENOUS | Status: AC
  Administered 2018-09-06: 4 mg via INTRAVENOUS
  Filled 2018-09-06: qty 1

## 2018-09-06 MED ORDER — ONDANSETRON HCL 4 MG/2ML IJ SOLN
4.0000 mg | Freq: Once | INTRAMUSCULAR | Status: AC
  Administered 2018-09-06: 4 mg via INTRAVENOUS
  Filled 2018-09-06: qty 2

## 2018-09-06 MED ORDER — SODIUM CHLORIDE 0.9 % IV BOLUS
2000.0000 mL | Freq: Once | INTRAVENOUS | Status: AC
  Administered 2018-09-06: 2000 mL via INTRAVENOUS

## 2018-09-06 MED ORDER — IOHEXOL 300 MG/ML  SOLN
100.0000 mL | Freq: Once | INTRAMUSCULAR | Status: AC | PRN
  Administered 2018-09-06: 100 mL via INTRAVENOUS

## 2018-09-06 MED ORDER — SODIUM CHLORIDE 0.9 % IJ SOLN
INTRAMUSCULAR | Status: AC
  Filled 2018-09-06: qty 50

## 2018-09-06 NOTE — Discharge Instructions (Signed)
I am not sure if your stomach pain is caused by a virus or if marijuana may be contributing.  Please make sure that you are staying well-hydrated.  I have given you a prescription for nausea medicine.  Start by eating bland foods and slowly adjusting back to a normal diet.  Today you received medications that may make you sleepy or impair your ability to make decisions.  For the next 24 hours please do not drive, operate heavy machinery, care for a small child with out another adult present, or perform any activities that may cause harm to you or someone else if you were to fall asleep or be impaired.   You are being prescribed a medication which may make you sleepy. Please follow up of listed precautions for at least 24 hours after taking one dose.

## 2018-09-06 NOTE — ED Notes (Signed)
Pt given apple juice at this time.

## 2018-09-06 NOTE — ED Notes (Signed)
Pt reminded of need for urine specimen 

## 2018-09-06 NOTE — ED Triage Notes (Addendum)
Pt c/o n/v/d since Sunday. Report hat she feels very weak and tired. Last marijuana use was Friday

## 2018-09-06 NOTE — ED Notes (Addendum)
Pt reminded of need for urine pt states she is still unable to void at this time

## 2018-09-06 NOTE — ED Notes (Signed)
Pt states she is unable to void at this time.

## 2018-09-06 NOTE — ED Notes (Signed)
Patient transported to CT 

## 2018-09-06 NOTE — ED Provider Notes (Signed)
[facility]-EMERGENCY DEPT Provider Note   CSN: [id number] Arrival date & time: 09/06/18  1321     History   Chief Complaint Chief Complaint  Patient presents with  . Emesis  . Diarrhea    HPI Crystal Wolfe is a 32 y.o. female with a past medical history of GERD, ventral hernia repair, who presents today for evaluation of nausea vomiting and diarrhea since Sunday.  She reports feeling very weak and generally tired.  Her last marijuana use was on Friday.  She denies urinary symptoms, did have diarrhea on the day of the onset of her symptoms, however since then has not had additional diarrhea.  She estimates that she has vomited about 10 times today.  She denies any blood in her vomit or bowel movements.  She denies any recent trauma.  No cough or shortness of breath.   HPI  Past Medical History:  Diagnosis Date  . GERD (gastroesophageal reflux disease)   . Pilonidal sinus with abscess April2013   drained April2013  . Ventral hernia     Patient Active Problem List   Diagnosis Date Noted  . Noninfectious gastroenteritis and colitis 04/02/2013  . Nausea & vomiting 04/01/2013  . Pilonidal abscess s/p I&D 2013 & 2014 03/24/2012  . Tobacco dependence 03/13/2012    Past Surgical History:  Procedure Laterality Date  . HERNIA REPAIR  June 2013   laparscopic  . INCISE AND DRAIN ABCESS  03/24/12   pilonidal abscess  . VENTRAL HERNIA REPAIR  05/2012   lap supraumb VWH repair     OB History   None      Home Medications    Prior to Admission medications   Medication Sig Start Date End Date Taking? Authorizing Provider  promethazine (PHENERGAN) 25 MG suppository Place 25 mg rectally every 6 (six) hours as needed for nausea or vomiting.   Yes [provider]  promethazine (PHENERGAN) 25 MG tablet Take 1 tablet (25 mg total) by mouth every 6 (six) hours as needed for nausea or vomiting. 04/16/18  Yes Upstill, Shari, PA-C  ondansetron (ZOFRAN-ODT) 4  MG disintegrating tablet Take 1 tablet (4 mg total) by mouth every 8 (eight) hours as needed for nausea or vomiting. 09/06/18   Cristina Gong, PA-C    Family History Family History  Problem Relation Age of Onset  . Hyperlipidemia Father   . Hypertension Paternal Grandmother   . Diabetes Paternal Grandmother   . Thyroid disease Mother   . Cancer Maternal Aunt        breast  . Hypertension Maternal Grandmother     Social History Social History   Tobacco Use  . Smoking status: Former Smoker    Packs/day: 0.20    Types: Cigarettes  . Smokeless tobacco: Never Used  Substance Use Topics  . Alcohol use: Yes    Alcohol/week: 1.0 standard drinks    Types: 1 Glasses of wine per week    Comment: rarely  . Drug use: Yes    Frequency: 7.0 times per week    Types: Marijuana    Comment: daily use     Allergies   Codeine and Penicillins   Review of Systems Review of Systems  Constitutional: Positive for chills. Negative for fever.  HENT: Negative for congestion.   Eyes: Negative for visual disturbance.  Respiratory: Negative for chest tightness and shortness of breath.   Cardiovascular: Negative for chest pain.  Gastrointestinal: Positive for abdominal pain, diarrhea, nausea and vomiting.  Genitourinary:  Negative for frequency, urgency, vaginal bleeding, vaginal discharge and vaginal pain.  Musculoskeletal: Negative for neck pain and neck stiffness.  Neurological: Negative for weakness and headaches.  All other systems reviewed and are negative.    Physical Exam Updated Vital Signs BP (!) 132/91 (BP Location: Right Arm)   Pulse 71   Temp 98.5 F (36.9 C) (Oral)   Resp 16   LMP 08/27/2018 Comment: negative HCG Blood test 09-06-2018  SpO2 100%   Physical Exam  Constitutional: She appears well-developed and well-nourished. No distress.  HENT:  Head: Normocephalic and atraumatic.  Eyes: Conjunctivae are normal.  Neck: Neck supple.  Cardiovascular: Normal rate  and regular rhythm.  No murmur heard. Pulmonary/Chest: Effort normal and breath sounds normal. No respiratory distress.  Abdominal: Soft. Bowel sounds are normal. She exhibits no distension. There is tenderness in the right lower quadrant and epigastric area. There is no guarding.  Palpation over epigastrium causes vomiting.    Musculoskeletal: She exhibits no edema.  Neurological: She is alert.  Skin: Skin is warm and dry.  Psychiatric: She has a normal mood and affect.  Nursing note and vitals reviewed.    ED Treatments / Results  Labs (all labs ordered are listed, but only abnormal results are displayed) Labs Reviewed  COMPREHENSIVE METABOLIC PANEL - Abnormal; Notable for the following components:      Result Value   Chloride 96 (*)    All other components within normal limits  URINALYSIS, ROUTINE W REFLEX MICROSCOPIC - Abnormal; Notable for the following components:   Specific Gravity, Urine >1.046 (*)    Ketones, ur 80 (*)    All other components within normal limits  LIPASE, BLOOD  CBC  I-STAT BETA HCG BLOOD, ED ([facility], [facility], [facility] ONLY)    EKG None  Radiology Ct Abdomen Pelvis W Contrast  Result Date: 09/06/2018 CLINICAL DATA:  Nausea, vomiting, and diarrhea for 2 days. EXAM: CT ABDOMEN AND PELVIS WITH CONTRAST TECHNIQUE: Multidetector CT imaging of the abdomen and pelvis was performed using the standard protocol following bolus administration of intravenous contrast. CONTRAST:  OMNIPAQUE IOHEXOL 300 MG/ML  SOLN COMPARISON:  March 29, 2013 FINDINGS: Lower chest: No acute abnormality. Hepatobiliary: No focal liver abnormality is seen. No gallstones, gallbladder wall thickening, or biliary dilatation. Pancreas: Unremarkable. No pancreatic ductal dilatation or surrounding inflammatory changes. Spleen: Normal in size without focal abnormality. Adrenals/Urinary Tract: Adrenal glands are unremarkable. Kidneys are normal, without renal calculi, focal lesion, or hydronephrosis.  Bladder is unremarkable. Stomach/Bowel: Stomach is within normal limits. Appendix appears normal. No evidence of bowel wall thickening, distention, or inflammatory changes. Vascular/Lymphatic: No significant vascular findings are present. No enlarged abdominal or pelvic lymph nodes. Reproductive: Uterus and bilateral adnexa are unremarkable. Other: No abdominal wall hernia or abnormality. No abdominopelvic ascites. Musculoskeletal: No acute or significant osseous findings. IMPRESSION: 1. No cause for the patient's symptoms identified. Electronically Signed   By: Gerome Sam III M.D   On: 09/06/2018 17:50    Procedures Procedures (including critical care time)  Medications Ordered in ED Medications  sodium chloride 0.9 % injection (has no administration in time range)  ondansetron (ZOFRAN) injection 4 mg (4 mg Intravenous Given 09/06/18 1606)  sodium chloride 0.9 % bolus 2,000 mL (0 mLs Intravenous Stopped 09/06/18 1710)  morphine 4 MG/ML injection 4 mg (4 mg Intravenous Given 09/06/18 1606)  iohexol (OMNIPAQUE) 300 MG/ML solution 100 mL (100 mLs Intravenous Contrast Given 09/06/18 1723)     Initial Impression / Assessment and Plan /  ED Course  I have reviewed the triage vital signs and the nursing notes.  Pertinent labs & imaging results that were available during my care of the patient were reviewed by me and considered in my medical decision making (see chart for details).  Clinical Course as of Sep 06 2149  Wed Sep 06, 2018  1900 Patient re-evaluated, feeling mildly better, IV is not flowing, asked RN to trouble shoot, will order GI cocktail.    [EH]  2051 Patient re-evaluated, fluids complete.  Will PO challenge.     [EH]    Clinical Course User Index [EH] Cristina Gong, PA-C   [facility] presents today for evaluation of abdominal pain and vomiting with diarrhea.  On exam she was TTP in the epigastric area (made her vomit) and in RLQ.  Given RLQ pain concern for  appendicitis.  Labs were obtained and reviewed, she does not have a leukocytosis, is not anemic, is with out any significant electrolyte disturbances.  She had mild dehydration, was given 2 liters IVF, Morphine, zofran, after which she reports improvement in her symptoms.    CT scan did not show any cause for her pain.  No appendicitis or pelvic masses seen.    She was PO challenged which she passed and requested discharge.  Will give RX for zofran ODT.  Return precautions were discussed with patient who states their understanding.  At the time of discharge patient denied any unaddressed complaints or concerns.  Patient is agreeable for discharge home.   Final Clinical Impressions(s) / ED Diagnoses   Final diagnoses:  Gastroenteritis  Generalized abdominal pain    ED Discharge Orders         Ordered    ondansetron (ZOFRAN-ODT) 4 MG disintegrating tablet  Every 8 hours PRN     09/06/18 2130           Cristina Gong, Cordelia Poche 09/06/18 2155    Terrilee Files, MD 09/07/18 [phone]

## 2018-12-22 ENCOUNTER — Other Ambulatory Visit: Payer: Self-pay

## 2018-12-22 ENCOUNTER — Encounter (HOSPITAL_COMMUNITY): Payer: Self-pay | Admitting: Emergency Medicine

## 2018-12-22 ENCOUNTER — Emergency Department (HOSPITAL_COMMUNITY)
Admission: EM | Admit: 2018-12-22 | Discharge: 2018-12-23 | Disposition: A | Discharge status: Home / Self Care | Payer: 59 | Attending: Emergency Medicine | Admitting: Emergency Medicine

## 2018-12-22 DIAGNOSIS — R1115 Cyclical vomiting syndrome unrelated to migraine: Secondary | ICD-10-CM | POA: Diagnosis not present

## 2018-12-22 DIAGNOSIS — F1290 Cannabis use, unspecified, uncomplicated: Secondary | ICD-10-CM | POA: Insufficient documentation

## 2018-12-22 DIAGNOSIS — R1110 Vomiting, unspecified: Secondary | ICD-10-CM | POA: Diagnosis present

## 2018-12-22 DIAGNOSIS — Z87891 Personal history of nicotine dependence: Secondary | ICD-10-CM | POA: Insufficient documentation

## 2018-12-22 LAB — URINALYSIS, ROUTINE W REFLEX MICROSCOPIC
Bilirubin Urine: NEGATIVE
Glucose, UA: NEGATIVE mg/dL
Hgb urine dipstick: NEGATIVE
Ketones, ur: 80 mg/dL — AB
Leukocytes, UA: NEGATIVE
Nitrite: NEGATIVE
Protein, ur: 30 mg/dL — AB
Specific Gravity, Urine: 1.027 (ref 1.005–1.030)
pH: 7 (ref 5.0–8.0)

## 2018-12-22 LAB — COMPREHENSIVE METABOLIC PANEL
ALBUMIN: 4.9 g/dL (ref 3.5–5.0)
ALT: 17 U/L (ref 0–44)
AST: 23 U/L (ref 15–41)
Alkaline Phosphatase: 65 U/L (ref 38–126)
Anion gap: 12 (ref 5–15)
BUN: 13 mg/dL (ref 6–20)
CO2: 24 mmol/L (ref 22–32)
Calcium: 9.6 mg/dL (ref 8.9–10.3)
Chloride: 102 mmol/L (ref 98–111)
Creatinine, Ser: 0.68 mg/dL (ref 0.44–1.00)
GFR calc Af Amer: >60 mL/min (ref >60)
GFR calc non Af Amer: >60 mL/min (ref >60)
Glucose, Bld: 135 mg/dL — ABNORMAL HIGH (ref 70–99)
Potassium: 4.4 mmol/L (ref 3.5–5.1)
Sodium: 138 mmol/L (ref 135–145)
Total Bilirubin: 0.5 mg/dL (ref 0.3–1.2)
Total Protein: 8.4 g/dL — ABNORMAL HIGH (ref 6.5–8.1)

## 2018-12-22 LAB — CBC
HCT: 39.7 % (ref 36.0–46.0)
HEMOGLOBIN: 12.8 g/dL (ref 12.0–15.0)
MCH: 30 pg (ref 26.0–34.0)
MCHC: 32.2 g/dL (ref 30.0–36.0)
MCV: 93 fL (ref 80.0–100.0)
Platelets: 316 10*3/uL (ref 150–400)
RBC: 4.27 MIL/uL (ref 3.87–5.11)
RDW: 12.5 % (ref 11.5–15.5)
WBC: 10.7 10*3/uL — ABNORMAL HIGH (ref 4.0–10.5)
nRBC: 0 % (ref 0.0–0.2)

## 2018-12-22 LAB — RAPID URINE DRUG SCREEN, HOSP PERFORMED
Amphetamines: NOT DETECTED
BARBITURATES: NOT DETECTED
Benzodiazepines: NOT DETECTED
Cocaine: NOT DETECTED
Opiates: NOT DETECTED
TETRAHYDROCANNABINOL: POSITIVE — AB

## 2018-12-22 LAB — I-STAT BETA HCG BLOOD, ED (MC, WL, AP ONLY): I-stat hCG, quantitative: <5 m[IU]/mL (ref <5)

## 2018-12-22 LAB — LIPASE, BLOOD: Lipase: 27 U/L (ref 11–51)

## 2018-12-22 MED ORDER — DICYCLOMINE HCL 20 MG PO TABS: 20.0000 mg | ORAL_TABLET | Freq: Two times a day (BID) | ORAL | 0 refills | Status: DC

## 2018-12-22 MED ORDER — ONDANSETRON 4 MG PO TBDP
4.0000 mg | ORAL_TABLET | Freq: Once | ORAL | Status: AC | PRN
  Administered 2018-12-22: 4 mg via ORAL
  Filled 2018-12-22: qty 1

## 2018-12-22 MED ORDER — ONDANSETRON 4 MG PO TBDP: 4.0000 mg | ORAL_TABLET | Freq: Three times a day (TID) | ORAL | 0 refills | Status: DC | PRN

## 2018-12-22 MED ORDER — CAPSAICIN 0.025 % EX CREA
TOPICAL_CREAM | Freq: Once | CUTANEOUS | Status: AC
  Administered 2018-12-22: via TOPICAL
  Filled 2018-12-22: qty 60

## 2018-12-22 MED ORDER — SODIUM CHLORIDE 0.9 % IV BOLUS
1000.0000 mL | Freq: Once | INTRAVENOUS | Status: AC
  Administered 2018-12-22: 1000 mL via INTRAVENOUS

## 2018-12-22 MED ORDER — HALOPERIDOL LACTATE 5 MG/ML IJ SOLN
5.0000 mg | Freq: Once | INTRAMUSCULAR | Status: AC
  Administered 2018-12-22: 2.5 mg via INTRAVENOUS
  Filled 2018-12-22: qty 1

## 2018-12-22 NOTE — ED Provider Notes (Signed)
[facility]-EMERGENCY DEPT Provider Note   CSN: [id number] Arrival date & time: 12/22/18  1753     History   Chief Complaint Chief Complaint  Patient presents with  . Emesis    HPI Crystal Wolfe is a 33 y.o. female.  HPI   Crystal Wolfe is a 33 y.o. female, with a history of GERD, presenting to the ED with nausea and vomiting beginning this morning. Endorses at least 10 episodes of nonbloody, nonbilious vomiting.  When asked about marijuana use, she originally states, "I have tried to cut back because last time they told me my symptoms like this were from marijuana use."  Patient then states her last marijuana use was yesterday and that she in truth has been "just trying not to use it every day."  Denies fever/chills, diarrhea, hematochezia/melena, cough, upper respiratory symptoms, urinary symptoms, or any other complaints.      Past Medical History:  Diagnosis Date  . GERD (gastroesophageal reflux disease)   . Pilonidal sinus with abscess April2013   drained April2013  . Ventral hernia     Patient Active Problem List   Diagnosis Date Noted  . Noninfectious gastroenteritis and colitis 04/02/2013  . Nausea & vomiting 04/01/2013  . Pilonidal abscess s/p I&D 2013 & 2014 03/24/2012  . Tobacco dependence 03/13/2012    Past Surgical History:  Procedure Laterality Date  . HERNIA REPAIR  June 2013   laparscopic  . INCISE AND DRAIN ABCESS  03/24/12   pilonidal abscess  . VENTRAL HERNIA REPAIR  05/2012   lap supraumb VWH repair     OB History   No obstetric history on file.      Home Medications    Prior to Admission medications   Medication Sig Start Date End Date Taking? Authorizing Provider  dicyclomine (BENTYL) 20 MG tablet Take 1 tablet (20 mg total) by mouth 2 (two) times daily. 12/22/18   Joy, Shawn C, PA-C  ondansetron (ZOFRAN ODT) 4 MG disintegrating tablet Take 1 tablet (4 mg total) by mouth every 8 (eight) hours as needed for  nausea or vomiting. 12/22/18   Joy, Hillard Danker, PA-C    Family History Family History  Problem Relation Age of Onset  . Hyperlipidemia Father   . Hypertension Paternal Grandmother   . Diabetes Paternal Grandmother   . Thyroid disease Mother   . Cancer Maternal Aunt        breast  . Hypertension Maternal Grandmother     Social History Social History   Tobacco Use  . Smoking status: Former Smoker    Packs/day: 0.20    Types: Cigarettes  . Smokeless tobacco: Never Used  Substance Use Topics  . Alcohol use: Yes    Alcohol/week: 1.0 standard drinks    Types: 1 Glasses of wine per week    Comment: rarely  . Drug use: Yes    Frequency: 7.0 times per week    Types: Marijuana    Comment: daily use     Allergies   Codeine and Penicillins   Review of Systems Review of Systems  Constitutional: Negative for chills and fever.  HENT: Negative for congestion.   Respiratory: Negative for cough and shortness of breath.   Cardiovascular: Negative for chest pain.  Gastrointestinal: Positive for nausea and vomiting. Negative for abdominal pain, blood in stool and diarrhea.  Genitourinary: Negative for dysuria, flank pain and hematuria.  Neurological: Negative for dizziness and syncope.  All other systems reviewed and are negative.  Physical Exam Updated Vital Signs BP (!) 173/93 (BP Location: Right Arm)   Pulse (!) 58   Temp 98.5 F (36.9 C) (Oral)   Resp 18   Ht 5\' 6"  (1.676 m)   LMP 12/13/2018   SpO2 100%   BMI 24.21 kg/m   Physical Exam Vitals signs and nursing note reviewed.  Constitutional:      General: She is not in acute distress.    Appearance: She is well-developed. She is not diaphoretic.  HENT:     Head: Normocephalic and atraumatic.     Mouth/Throat:     Mouth: Mucous membranes are moist.     Pharynx: Oropharynx is clear.  Eyes:     Conjunctiva/sclera: Conjunctivae normal.  Neck:     Musculoskeletal: Neck supple.  Cardiovascular:     Rate and  Rhythm: Normal rate and regular rhythm.     Pulses: Normal pulses.     Heart sounds: Normal heart sounds.  Pulmonary:     Effort: Pulmonary effort is normal. No respiratory distress.     Breath sounds: Normal breath sounds.  Abdominal:     Palpations: Abdomen is soft.     Tenderness: There is no abdominal tenderness. There is no guarding.  Musculoskeletal:     Right lower leg: No edema.     Left lower leg: No edema.  Lymphadenopathy:     Cervical: No cervical adenopathy.  Skin:    General: Skin is warm and dry.  Neurological:     Mental Status: She is alert.  Psychiatric:        Mood and Affect: Mood and affect normal.        Speech: Speech normal.        Behavior: Behavior normal.      ED Treatments / Results  Labs (all labs ordered are listed, but only abnormal results are displayed) Labs Reviewed  COMPREHENSIVE METABOLIC PANEL - Abnormal; Notable for the following components:      Result Value   Glucose, Bld 135 (*)    Total Protein 8.4 (*)    All other components within normal limits  CBC - Abnormal; Notable for the following components:   WBC 10.7 (*)    All other components within normal limits  URINALYSIS, ROUTINE W REFLEX MICROSCOPIC - Abnormal; Notable for the following components:   APPearance HAZY (*)    Ketones, ur 80 (*)    Protein, ur 30 (*)    Bacteria, UA RARE (*)    All other components within normal limits  RAPID URINE DRUG SCREEN, HOSP PERFORMED - Abnormal; Notable for the following components:   Tetrahydrocannabinol POSITIVE (*)    All other components within normal limits  LIPASE, BLOOD  I-STAT BETA HCG BLOOD, ED ([facility], [facility], [facility] ONLY)  I-STAT BETA HCG BLOOD, ED ([facility], [facility], [facility] ONLY)    EKG EKG Interpretation  Date/Time:  Friday December 22 2018 21:21:53 EST Ventricular Rate:  64 PR Interval:    QRS Duration: 100 QT Interval:  413 QTC Calculation: 427 R Axis:   73 Text Interpretation:  Sinus rhythm Confirmed by Virgina NorfolkAdam, [place] [phone]) on  12/22/2018 11:23:40 PM   Radiology No results found.  Procedures Procedures (including critical care time)  Medications Ordered in ED Medications  ondansetron (ZOFRAN-ODT) disintegrating tablet 4 mg (4 mg Oral Given 12/22/18 1809)  sodium chloride 0.9 % bolus 1,000 mL (0 mLs Intravenous Stopped 12/22/18 2351)  capsaicin (ZOSTRIX) 0.025 % cream ( Topical Given 12/22/18 2351)  haloperidol lactate (  HALDOL) injection 5 mg (2.5 mg Intravenous Given 12/22/18 2249)     Initial Impression / Assessment and Plan / ED Course  I have reviewed the triage vital signs and the nursing notes.  Pertinent labs & imaging results that were available during my care of the patient were reviewed by me and considered in my medical decision making (see chart for details).  Clinical Course as of Dec 23 1  Caleen EssexFri Dec 22, 2018  2329 Patient states she feels much better and would like to go home.   [SJ]    Clinical Course User Index [SJ] Joy, Shawn C, PA-C    Patient presents with nausea and vomiting. Patient is nontoxic appearing, afebrile, not tachycardic, not tachypneic, not hypotensive, maintains excellent SPO2 on room air.  Abdominal exam benign.  Ketonuria noted and suspected to be due to dehydration.  Symptoms resolved during ED course.  UDS positive for THC and cannabinoid hyperemesis syndrome is high on the differential.  Patient was counseled on this matter. The patient was given instructions for home care as well as return precautions. Patient voices understanding of these instructions, accepts the plan, and is comfortable with discharge.   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

## 2018-12-22 NOTE — ED Notes (Signed)
Bed: WA01 Expected date:  Expected time:  Means of arrival:  Comments: [employer]

## 2018-12-22 NOTE — ED Notes (Signed)
Pt was given water for PO fluid challenge.

## 2018-12-22 NOTE — ED Triage Notes (Signed)
Pt been vomiting all day. Denies urinary or bowel problems. Does use THC.

## 2018-12-22 NOTE — Discharge Instructions (Signed)
Nausea and Vomiting  Hand washing: Wash your hands throughout the day, but especially before and after touching the face, using the restroom, sneezing, coughing, or touching surfaces that have been coughed or sneezed upon. Hydration: Symptoms will be intensified and complicated by dehydration. Dehydration can also extend the duration of symptoms. Drink plenty of fluids and get plenty of rest. You should be drinking at least half a liter of water an hour to stay hydrated. Electrolyte drinks (ex. Gatorade, Powerade, Pedialyte) are also encouraged. You should be drinking enough fluids to make your urine light yellow, almost clear. If this is not the case, you are not drinking enough water. Please note that some of the treatments indicated below will not be effective if you are not adequately hydrated. Diet: Please concentrate on hydration, however, you may introduce food slowly.  Start with a clear liquid diet, progressed to a full liquid diet, and then bland solids as you are able. Pain or fever: Ibuprofen, Naproxen, or Tylenol for pain or fever.  Nausea/vomiting: Use the Zofran for nausea or vomiting. Diarrhea: May use medications such as loperamide (Imodium) or Bismuth subsalicylate (Pepto-Bismol). Bentyl: This medication is what is known as an antispasmodic and is intended to help reduce abdominal discomfort. Follow-up: Follow-up with a primary care provider on this matter. Return: Return should you develop a fever, bloody diarrhea, increased abdominal pain, uncontrolled vomiting, or any other major concerns.  For prescription assistance, may try using prescription discount sites or apps, such as goodrx.com

## 2018-12-22 NOTE — ED Notes (Signed)
Pt is aware she needs to give a urine sample 

## 2018-12-22 NOTE — ED Notes (Signed)
Patient is resting comfortably. 

## 2023-02-24 ENCOUNTER — Ambulatory Visit: Payer: Self-pay | Admitting: Surgery

## 2023-02-24 NOTE — H&P (Signed)
   Crystal Wolfe [id number]   Referring Provider:  Collene Leyden, MD   Subjective   Chief Complaint: New Consultation (Pilonidal cyst)     History of Present Illness: Very pleasant 37 year old woman with history of generalized anxiety disorder, cyclic vomiting episodes, marijuana use, obesity, prediabetes, eczema, GERD who is referred for evaluation of a pilonidal cyst.  She has dealt with this for several years.  She had an I&D in the office done by Dr. Johney Maine a few years ago right before he repaired a ventral hernia.  It was quiescent for some time after this however in the last couple years she has had recurrent intermittent pain and swelling followed by purulent drainage.    Review of Systems: A complete review of systems was obtained from the patient.  I have reviewed this information and discussed as appropriate with the patient.  See HPI as well for other ROS.   Medical History: Past Medical History:  Diagnosis Date   Anxiety     There is no problem list on file for this patient.   Past Surgical History:  Procedure Laterality Date   HERNIA REPAIR       No Known Allergies  Current Outpatient Medications on File Prior to Visit  Medication Sig Dispense Refill   sertraline (ZOLOFT) 25 MG tablet Take 25 mg by mouth once daily     No current facility-administered medications on file prior to visit.    Family History  Problem Relation Age of Onset   High blood pressure (Hypertension) Maternal Grandmother    No Known Problems Other      Social History   Tobacco Use  Smoking Status Every Day   Types: Cigarettes  Smokeless Tobacco Current     Social History   Socioeconomic History   Marital status: Single  Tobacco Use   Smoking status: Every Day    Types: Cigarettes   Smokeless tobacco: Current  Vaping Use   Vaping Use: Every day  Substance and Sexual Activity   Alcohol use: Not Currently   Drug use: Never    Objective:    Vitals:   02/24/23 1007   BP: 115/70  Pulse: 89  Temp: 36.5 C (97.7 F)  SpO2: 97%  Weight: (!) 105.2 kg (232 lb)  Height: 167.6 cm (5\' 6" )  PainSc: 0-No pain    Body mass index is 37.45 kg/m.  Gen: A&Ox3, no distress  Unlabored respirations At the superior aspect of the natal cleft to the patient's right side is an area of chronic scarring and surrounding hyperpigmentation with underlying induration consistent with a pilonidal cyst.  Just inferior to this in the midline are 2 small midline pits.  No active fluctuance or erythema today.  Assessment and Plan:  Diagnoses and all orders for this visit:  Pilonidal cyst    We discussed the etiology and natural history of this disease and options for treatment.  I recommend trephination.  We went over the procedure and typical postop recovery.  Discussed risks of bleeding, infection, pain, scarring, wound healing problems or undesired cosmetic result, recurrence of pilonidal disease.  Questions welcomed and answered.  She wishes to proceed with scheduling.  [name], MD

## 2024-09-27 ENCOUNTER — Other Ambulatory Visit: Payer: Self-pay

## 2024-09-27 ENCOUNTER — Ambulatory Visit: Admission: EM | Admit: 2024-09-27 | Discharge: 2024-09-27 | Disposition: A | Discharge status: Home / Self Care

## 2024-09-27 DIAGNOSIS — L259 Unspecified contact dermatitis, unspecified cause: Secondary | ICD-10-CM | POA: Diagnosis not present

## 2024-09-27 MED ORDER — TRIAMCINOLONE ACETONIDE 0.5 % EX OINT: 1.0000 | TOPICAL_OINTMENT | Freq: Two times a day (BID) | CUTANEOUS | 0 refills | Status: AC

## 2024-09-27 NOTE — ED Triage Notes (Signed)
 Pt presents with a chief complaint of rash on left arm and left side of abdomen. Noticed this morning in shower. No pain. Does endorse little itching. Does have a hx of eczema. No medications applied to areas PTA.

## 2024-09-27 NOTE — ED Provider Notes (Signed)
 [facility]    CSN: [id number] Arrival date & time: 09/27/24  1328      History   Chief Complaint Chief Complaint  Patient presents with   Rash    HPI Crystal Wolfe is a 38 y.o. female.   Discussed the use of AI scribe software for clinical note transcription with the patient, who gave verbal consent to proceed.   The patient presents with a rash on her left arm and the side of her belly that she first noticed this morning in the shower. The rash itches a little bit but does not hurt. She denies any previous episodes of similar rashes and reports no new exposures to soaps, lotions, detergents, medications, or spices. She has not stayed at [employer] or hotels recently.  The patient reports being outside to take her dogs out and was cleaning her room and washing clothes last night, during which her allergies started acting up from dust or pet dander that was stirred up from moving the clothes. She is uncertain if something bit her while she was lying on the clothes. The patient denies fevers, chills, body aches, headache, neck pain, or neck stiffness. She has not tried any treatments for the rash prior to this visit.  The following sections of the patient's history were reviewed and updated as appropriate: allergies, current medications, past family history, past medical history, past social history, past surgical history, and problem list.      Past Medical History:  Diagnosis Date   GERD (gastroesophageal reflux disease)    Pilonidal sinus with abscess April2013   drained [id number]   Ventral hernia     Patient Active Problem List   Diagnosis Date Noted   Noninfectious gastroenteritis and colitis 04/02/2013   Nausea & vomiting 04/01/2013   Pilonidal abscess s/p I&D 2013 & 2014 03/24/2012   Tobacco dependence 03/13/2012    Past Surgical History:  Procedure Laterality Date   HERNIA REPAIR  June 2013   laparscopic   INCISE AND DRAIN ABCESS  03/24/12    pilonidal abscess   VENTRAL HERNIA REPAIR  05/2012   lap supraumb VWH repair    OB History   No obstetric history on file.      Home Medications    Prior to Admission medications   Medication Sig Start Date End Date Taking? Authorizing Provider  metroNIDAZOLE (FLAGYL) 500 MG tablet Take 500 mg by mouth 2 (two) times daily. 09/20/24  Yes [provider]  mometasone (ELOCON) 0.1 % cream Apply to affected areas twice daily for 2 wk, then daily x 2 wk. Repeat if needed. Not to face 03/15/22  Yes [provider]  sertraline (ZOLOFT) 25 MG tablet Take 25 mg by mouth. 12/25/21  Yes [provider]  triamcinolone ointment (KENALOG) 0.5 % Apply 1 Application topically 2 (two) times daily. Apply thin layer to affected areas twice a day for up to 2 weeks 09/27/24  Yes Amarise Lillo, FNP  valACYclovir (VALTREX) 500 MG tablet Take 500 mg by mouth daily as needed. 09/20/24  Yes [provider]    Family History Family History  Problem Relation Age of Onset   Hyperlipidemia Father    Hypertension Paternal Grandmother    Diabetes Paternal Grandmother    Thyroid disease Mother    Cancer Maternal Aunt        breast   Hypertension Maternal Grandmother     Social History Social History   Tobacco Use   Smoking status: Former  Current packs/day: 0.20    Types: Cigarettes   Smokeless tobacco: Never  Vaping Use   Vaping status: Never Used  Substance Use Topics   Alcohol use: Yes    Alcohol/week: 1.0 standard drink of alcohol    Types: 1 Glasses of wine per week    Comment: rarely   Drug use: Yes    Frequency: 7.0 times per week    Types: Marijuana    Comment: daily use     Allergies   Codeine and Penicillins   Review of Systems Review of Systems  Constitutional:  Negative for chills and fever.  Gastrointestinal:  Negative for nausea and vomiting.  Musculoskeletal:  Negative for neck pain and neck stiffness.  Skin:  Positive for rash.   Neurological:  Negative for headaches.  All other systems reviewed and are negative.    Physical Exam Triage Vital Signs ED Triage Vitals  Encounter Vitals Group     BP 09/27/24 1352 133/81     Girls Systolic BP Percentile --      Girls Diastolic BP Percentile --      Boys Systolic BP Percentile --      Boys Diastolic BP Percentile --      Pulse Rate 09/27/24 1352 68     Resp 09/27/24 1352 17     Temp 09/27/24 1352 98.3 F (36.8 C)     Temp Source 09/27/24 1352 Oral     SpO2 09/27/24 1352 97 %     Weight 09/27/24 1354 214 lb (97.1 kg)     Height 09/27/24 1354 5' 6 (1.676 m)     Head Circumference --      Peak Flow --      Pain Score 09/27/24 1354 0     Pain Loc --      Pain Education --      Exclude from Growth Chart --    No data found.  Updated Vital Signs BP 133/81 (BP Location: Right Arm)   Pulse 68   Temp 98.3 F (36.8 C) (Oral)   Resp 17   Ht 5' 6 (1.676 m)   Wt 214 lb (97.1 kg)   LMP 09/06/2024 (Exact Date)   SpO2 97%   BMI 34.54 kg/m   Visual Acuity Right Eye Distance:   Left Eye Distance:   Bilateral Distance:    Right Eye Near:   Left Eye Near:    Bilateral Near:     Physical Exam Vitals reviewed.  Constitutional:      General: She is awake. She is not in acute distress.    Appearance: Normal appearance. She is well-developed. She is not ill-appearing, toxic-appearing or diaphoretic.  HENT:     Head: Normocephalic.     Right Ear: Hearing normal.     Left Ear: Hearing normal.     Nose: Nose normal.     Mouth/Throat:     Mouth: Mucous membranes are moist.  Eyes:     General: Vision grossly intact.     Conjunctiva/sclera: Conjunctivae normal.  Cardiovascular:     Rate and Rhythm: Normal rate and regular rhythm.     Heart sounds: Normal heart sounds.  Pulmonary:     Effort: Pulmonary effort is normal.     Breath sounds: Normal breath sounds and air entry.  Musculoskeletal:        General: Normal range of motion.     Cervical back:  Normal range of motion and neck supple.  Skin:  General: Skin is warm and dry.     Findings: Rash present. Rash is papular.  Neurological:     General: No focal deficit present.     Mental Status: She is alert and oriented to person, place, and time.  Psychiatric:        Speech: Speech normal.        Behavior: Behavior is cooperative.     #1 - left upper arm    #2 - left back, under bra   #3 - left side, flank area   UC Treatments / Results  Labs (all labs ordered are listed, but only abnormal results are displayed) Labs Reviewed - No data to display  EKG   Radiology No results found.  Procedures Procedures (including critical care time)  Medications Ordered in UC Medications - No data to display  Initial Impression / Assessment and Plan / UC Course  I have reviewed the triage vital signs and the nursing notes.  Pertinent labs & imaging results that were available during my care of the patient were reviewed by me and considered in my medical decision making (see chart for details).      Patient presents with a new rash on the left arm and side of the abdomen, first noticed this morning, associated with mild itching and no pain. Examination reveals scattered papular rash across multiple dermatomes without pustules, redness, or vesicular lesions. Given the rash distribution and absence of pain or vesicles, shingles was ruled out. Scabies was also considered but deemed unlikely based on distribution, lack of intense itching, and no known exposure. Findings are most consistent with contact dermatitis, likely related to recent environmental exposure such as cleaning activities involving dust or pet dander.  Topical steroid ointment was prescribed to apply twice daily for up to two weeks, with instructions to discontinue once the rash improves. Benadryl was recommended regularly for the next day, then as needed for itching. The patient was advised to avoid scented soaps,  lotions, and detergents to prevent further irritation. Follow-up with primary care is advised if the rash does not improve within 7-10 days or worsens. The patient should seek urgent evaluation if new symptoms develop, such as spreading redness, swelling, stinging or burning pain, or drainage from the rash. A work note was provided as requested.  Today's evaluation has revealed no signs of a dangerous process. Discussed diagnosis with patient and/or guardian. Patient and/or guardian aware of their diagnosis, possible red flag symptoms to watch out for and need for close follow up. Patient and/or guardian understands verbal and written discharge instructions. Patient and/or guardian comfortable with plan and disposition.  Patient and/or guardian has a clear mental status at this time, good insight into illness (after discussion and teaching) and has clear judgment to make decisions regarding their care  Documentation was completed with the aid of voice recognition software. Transcription may contain typographical errors.  Final Clinical Impressions(s) / UC Diagnoses   Final diagnoses:  Contact dermatitis, unspecified contact dermatitis type, unspecified trigger     Discharge Instructions      You were seen today for a new rash on your left arm and side of your belly that started this morning. The rash does not show signs of infection or shingles. Based on your symptoms and exam, this appears to be contact dermatitis, which is a mild skin reaction caused by something that irritated or came into contact with your skin. This may be related to recent cleaning or exposure to dust or  pet dander.  You were prescribed a steroid ointment to help reduce redness, itching, and inflammation. Apply a thin layer to the affected areas twice a day for up to two weeks, and stop using it once the rash has improved. You may take Benadryl regularly for the next 24 hours, then as needed for itching. Avoid scratching the  rash and try to keep the area clean and dry. Do not use scented soaps, lotions, or detergents, as these can make the irritation worse. Wearing loose, breathable clothing may also help with comfort.  Most rashes from contact dermatitis begin to improve within a few days. Follow up with your primary care provider if the rash worsens, spreads, or does not start to get better within a week. Go to the emergency department right away if you develop severe swelling, pain, drainage, blistering, or signs of infection such as fever or spreading redness.      ED Prescriptions     Medication Sig Dispense Auth. Provider   triamcinolone ointment (KENALOG) 0.5 % Apply 1 Application topically 2 (two) times daily. Apply thin layer to affected areas twice a day for up to 2 weeks 30 g Iola Lukes, FNP      PDMP not reviewed this encounter.   Iola Lukes, [place] 09/27/24 1810

## 2024-09-27 NOTE — Discharge Instructions (Addendum)
 You were seen today for a new rash on your left arm and side of your belly that started this morning. The rash does not show signs of infection or shingles. Based on your symptoms and exam, this appears to be contact dermatitis, which is a mild skin reaction caused by something that irritated or came into contact with your skin. This may be related to recent cleaning or exposure to dust or pet dander.  You were prescribed a steroid ointment to help reduce redness, itching, and inflammation. Apply a thin layer to the affected areas twice a day for up to two weeks, and stop using it once the rash has improved. You may take Benadryl regularly for the next 24 hours, then as needed for itching. Avoid scratching the rash and try to keep the area clean and dry. Do not use scented soaps, lotions, or detergents, as these can make the irritation worse. Wearing loose, breathable clothing may also help with comfort.  Most rashes from contact dermatitis begin to improve within a few days. Follow up with your primary care provider if the rash worsens, spreads, or does not start to get better within a week. Go to the emergency department right away if you develop severe swelling, pain, drainage, blistering, or signs of infection such as fever or spreading redness.
